# Patient Record
Sex: Female | Born: 1940 | Race: White | Hispanic: No | Marital: Married | Smoking: Never smoker
Health system: Southern US, Community
[De-identification: ages and names within clinical notes are randomized; demographics above are authoritative.]

## PROBLEM LIST (undated history)

## (undated) DIAGNOSIS — K219 Gastro-esophageal reflux disease without esophagitis: Secondary | ICD-10-CM

## (undated) DIAGNOSIS — K7460 Unspecified cirrhosis of liver: Secondary | ICD-10-CM

## (undated) DIAGNOSIS — F0390 Unspecified dementia without behavioral disturbance: Secondary | ICD-10-CM

## (undated) DIAGNOSIS — I4891 Unspecified atrial fibrillation: Secondary | ICD-10-CM

## (undated) DIAGNOSIS — E785 Hyperlipidemia, unspecified: Secondary | ICD-10-CM

## (undated) DIAGNOSIS — I10 Essential (primary) hypertension: Secondary | ICD-10-CM

## (undated) DIAGNOSIS — M1990 Unspecified osteoarthritis, unspecified site: Secondary | ICD-10-CM

## (undated) DIAGNOSIS — E119 Type 2 diabetes mellitus without complications: Secondary | ICD-10-CM

## (undated) DIAGNOSIS — E039 Hypothyroidism, unspecified: Secondary | ICD-10-CM

## (undated) DIAGNOSIS — C50919 Malignant neoplasm of unspecified site of unspecified female breast: Secondary | ICD-10-CM

## (undated) HISTORY — DX: Unspecified cirrhosis of liver: K74.60

## (undated) HISTORY — PX: BREAST SURGERY: SHX581

## (undated) HISTORY — DX: Malignant neoplasm of unspecified site of unspecified female breast: C50.919

## (undated) HISTORY — DX: Unspecified atrial fibrillation: I48.91

## (undated) HISTORY — DX: Hypothyroidism, unspecified: E03.9

## (undated) HISTORY — DX: Hyperlipidemia, unspecified: E78.5

---

## 1998-09-24 DIAGNOSIS — C50919 Malignant neoplasm of unspecified site of unspecified female breast: Secondary | ICD-10-CM

## 1998-09-24 HISTORY — DX: Malignant neoplasm of unspecified site of unspecified female breast: C50.919

## 2012-03-24 HISTORY — PX: COLONOSCOPY: SHX174

## 2012-12-23 ENCOUNTER — Encounter (INDEPENDENT_AMBULATORY_CARE_PROVIDER_SITE_OTHER): Payer: Medicare Other | Admitting: Internal Medicine

## 2012-12-23 DIAGNOSIS — Z170 Estrogen receptor positive status [ER+]: Secondary | ICD-10-CM

## 2012-12-23 DIAGNOSIS — C50919 Malignant neoplasm of unspecified site of unspecified female breast: Secondary | ICD-10-CM

## 2013-02-04 ENCOUNTER — Encounter (INDEPENDENT_AMBULATORY_CARE_PROVIDER_SITE_OTHER): Payer: Medicare Other | Admitting: Internal Medicine

## 2013-02-04 DIAGNOSIS — C50919 Malignant neoplasm of unspecified site of unspecified female breast: Secondary | ICD-10-CM

## 2013-08-25 ENCOUNTER — Encounter (INDEPENDENT_AMBULATORY_CARE_PROVIDER_SITE_OTHER): Payer: Medicare Other

## 2013-08-25 DIAGNOSIS — D61818 Other pancytopenia: Secondary | ICD-10-CM

## 2013-08-25 DIAGNOSIS — E039 Hypothyroidism, unspecified: Secondary | ICD-10-CM

## 2013-08-25 DIAGNOSIS — D509 Iron deficiency anemia, unspecified: Secondary | ICD-10-CM

## 2013-09-02 ENCOUNTER — Encounter (INDEPENDENT_AMBULATORY_CARE_PROVIDER_SITE_OTHER): Payer: Medicare Other

## 2013-09-02 DIAGNOSIS — R161 Splenomegaly, not elsewhere classified: Secondary | ICD-10-CM

## 2013-09-02 DIAGNOSIS — R748 Abnormal levels of other serum enzymes: Secondary | ICD-10-CM

## 2013-09-02 DIAGNOSIS — D61818 Other pancytopenia: Secondary | ICD-10-CM

## 2013-09-02 DIAGNOSIS — C50919 Malignant neoplasm of unspecified site of unspecified female breast: Secondary | ICD-10-CM

## 2013-10-01 DIAGNOSIS — D732 Chronic congestive splenomegaly: Secondary | ICD-10-CM | POA: Insufficient documentation

## 2013-10-01 DIAGNOSIS — D6959 Other secondary thrombocytopenia: Secondary | ICD-10-CM | POA: Insufficient documentation

## 2013-10-01 DIAGNOSIS — D731 Hypersplenism: Secondary | ICD-10-CM | POA: Insufficient documentation

## 2013-10-01 DIAGNOSIS — K7460 Unspecified cirrhosis of liver: Secondary | ICD-10-CM | POA: Insufficient documentation

## 2013-10-05 DIAGNOSIS — E611 Iron deficiency: Secondary | ICD-10-CM | POA: Insufficient documentation

## 2014-07-06 ENCOUNTER — Emergency Department (HOSPITAL_COMMUNITY): Payer: Medicare Other

## 2014-07-06 ENCOUNTER — Inpatient Hospital Stay (HOSPITAL_COMMUNITY)
Admission: EM | Admit: 2014-07-06 | Discharge: 2014-07-09 | DRG: 184 | Disposition: A | Discharge status: Skilled Nursing Facility | Payer: Medicare Other | Attending: General Surgery | Admitting: General Surgery

## 2014-07-06 ENCOUNTER — Encounter (HOSPITAL_COMMUNITY): Payer: Self-pay | Admitting: Emergency Medicine

## 2014-07-06 DIAGNOSIS — M1990 Unspecified osteoarthritis, unspecified site: Secondary | ICD-10-CM | POA: Diagnosis present

## 2014-07-06 DIAGNOSIS — Z794 Long term (current) use of insulin: Secondary | ICD-10-CM

## 2014-07-06 DIAGNOSIS — K7460 Unspecified cirrhosis of liver: Secondary | ICD-10-CM | POA: Diagnosis present

## 2014-07-06 DIAGNOSIS — D62 Acute posthemorrhagic anemia: Secondary | ICD-10-CM | POA: Diagnosis not present

## 2014-07-06 DIAGNOSIS — I10 Essential (primary) hypertension: Secondary | ICD-10-CM | POA: Diagnosis present

## 2014-07-06 DIAGNOSIS — S8002XA Contusion of left knee, initial encounter: Secondary | ICD-10-CM | POA: Diagnosis present

## 2014-07-06 DIAGNOSIS — S62101A Fracture of unspecified carpal bone, right wrist, initial encounter for closed fracture: Secondary | ICD-10-CM | POA: Diagnosis present

## 2014-07-06 DIAGNOSIS — S8001XA Contusion of right knee, initial encounter: Secondary | ICD-10-CM | POA: Diagnosis present

## 2014-07-06 DIAGNOSIS — R52 Pain, unspecified: Secondary | ICD-10-CM

## 2014-07-06 DIAGNOSIS — E039 Hypothyroidism, unspecified: Secondary | ICD-10-CM | POA: Diagnosis present

## 2014-07-06 DIAGNOSIS — Z23 Encounter for immunization: Secondary | ICD-10-CM

## 2014-07-06 DIAGNOSIS — R911 Solitary pulmonary nodule: Secondary | ICD-10-CM | POA: Diagnosis present

## 2014-07-06 DIAGNOSIS — Z79899 Other long term (current) drug therapy: Secondary | ICD-10-CM | POA: Diagnosis not present

## 2014-07-06 DIAGNOSIS — E119 Type 2 diabetes mellitus without complications: Secondary | ICD-10-CM | POA: Diagnosis present

## 2014-07-06 DIAGNOSIS — S2242XA Multiple fractures of ribs, left side, initial encounter for closed fracture: Principal | ICD-10-CM | POA: Diagnosis present

## 2014-07-06 DIAGNOSIS — R51 Headache: Secondary | ICD-10-CM | POA: Diagnosis present

## 2014-07-06 DIAGNOSIS — D696 Thrombocytopenia, unspecified: Secondary | ICD-10-CM | POA: Diagnosis present

## 2014-07-06 HISTORY — DX: Essential (primary) hypertension: I10

## 2014-07-06 HISTORY — DX: Type 2 diabetes mellitus without complications: E11.9

## 2014-07-06 LAB — SAMPLE TO BLOOD BANK

## 2014-07-06 LAB — COMPREHENSIVE METABOLIC PANEL
ALK PHOS: 162 U/L — AB (ref 39–117)
ALT: 26 U/L (ref 0–35)
ANION GAP: 13 (ref 5–15)
AST: 49 U/L — ABNORMAL HIGH (ref 0–37)
Albumin: 3 g/dL — ABNORMAL LOW (ref 3.5–5.2)
BILIRUBIN TOTAL: 0.8 mg/dL (ref 0.3–1.2)
BUN: 21 mg/dL (ref 6–23)
CHLORIDE: 103 meq/L (ref 96–112)
CO2: 22 meq/L (ref 19–32)
CREATININE: 1.11 mg/dL — AB (ref 0.50–1.10)
Calcium: 10.3 mg/dL (ref 8.4–10.5)
GFR, EST AFRICAN AMERICAN: 56 mL/min — AB (ref >90)
GFR, EST NON AFRICAN AMERICAN: 48 mL/min — AB (ref >90)
GLUCOSE: 219 mg/dL — AB (ref 70–99)
Potassium: 4.5 mEq/L (ref 3.7–5.3)
Sodium: 138 mEq/L (ref 137–147)
Total Protein: 7.2 g/dL (ref 6.0–8.3)

## 2014-07-06 LAB — ETHANOL: Alcohol, Ethyl (B): <11 mg/dL (ref 0–11)

## 2014-07-06 LAB — CBC
HCT: 37.2 % (ref 36.0–46.0)
Hemoglobin: 12.6 g/dL (ref 12.0–15.0)
MCH: 29.5 pg (ref 26.0–34.0)
MCHC: 33.9 g/dL (ref 30.0–36.0)
MCV: 87.1 fL (ref 78.0–100.0)
PLATELETS: 78 10*3/uL — AB (ref 150–400)
RBC: 4.27 MIL/uL (ref 3.87–5.11)
RDW: 14.1 % (ref 11.5–15.5)
WBC: 4.9 10*3/uL (ref 4.0–10.5)

## 2014-07-06 LAB — PROTIME-INR
INR: 1.33 (ref 0.00–1.49)
PROTHROMBIN TIME: 16.5 s — AB (ref 11.6–15.2)

## 2014-07-06 LAB — CDS SEROLOGY

## 2014-07-06 MED ORDER — NALOXONE HCL 0.4 MG/ML IJ SOLN: 0.4000 mg | INTRAMUSCULAR | Status: DC | PRN

## 2014-07-06 MED ORDER — KCL IN DEXTROSE-NACL 20-5-0.45 MEQ/L-%-% IV SOLN
INTRAVENOUS | Status: DC
  Administered 2014-07-07 (×3): via INTRAVENOUS
  Filled 2014-07-06 (×4): qty 1000

## 2014-07-06 MED ORDER — ONDANSETRON HCL 4 MG/2ML IJ SOLN: 4.0000 mg | Freq: Four times a day (QID) | INTRAMUSCULAR | Status: DC | PRN

## 2014-07-06 MED ORDER — INSULIN ASPART 100 UNIT/ML ~~LOC~~ SOLN
0.0000 [IU] | SUBCUTANEOUS | Status: DC
  Administered 2014-07-07: 11 [IU] via SUBCUTANEOUS
  Administered 2014-07-07: 7 [IU] via SUBCUTANEOUS
  Administered 2014-07-07: 11 [IU] via SUBCUTANEOUS

## 2014-07-06 MED ORDER — ONDANSETRON HCL 4 MG PO TABS: 4.0000 mg | ORAL_TABLET | Freq: Four times a day (QID) | ORAL | Status: DC | PRN

## 2014-07-06 MED ORDER — LISINOPRIL 10 MG PO TABS
10.0000 mg | ORAL_TABLET | Freq: Every day | ORAL | Status: DC
  Administered 2014-07-07 – 2014-07-09 (×2): 10 mg via ORAL
  Filled 2014-07-06 (×3): qty 1

## 2014-07-06 MED ORDER — IOHEXOL 300 MG/ML  SOLN
80.0000 mL | Freq: Once | INTRAMUSCULAR | Status: AC | PRN
  Administered 2014-07-06: 80 mL via INTRAVENOUS

## 2014-07-06 MED ORDER — FENTANYL CITRATE 0.05 MG/ML IJ SOLN
50.0000 ug | Freq: Once | INTRAMUSCULAR | Status: AC
  Administered 2014-07-06: 50 ug via INTRAVENOUS
  Filled 2014-07-06: qty 2

## 2014-07-06 MED ORDER — TETANUS-DIPHTH-ACELL PERTUSSIS 5-2.5-18.5 LF-MCG/0.5 IM SUSP
0.5000 mL | Freq: Once | INTRAMUSCULAR | Status: AC
  Administered 2014-07-06: 0.5 mL via INTRAMUSCULAR
  Filled 2014-07-06: qty 0.5

## 2014-07-06 MED ORDER — MORPHINE SULFATE (PF) 1 MG/ML IV SOLN
INTRAVENOUS | Status: DC
  Administered 2014-07-07: 1 mg via INTRAVENOUS
  Administered 2014-07-07 (×2): 3 mg via INTRAVENOUS
  Filled 2014-07-06: qty 25

## 2014-07-06 MED ORDER — SODIUM CHLORIDE 0.9 % IJ SOLN: 9.0000 mL | INTRAMUSCULAR | Status: DC | PRN

## 2014-07-06 MED ORDER — SODIUM CHLORIDE 0.9 % IV BOLUS (SEPSIS)
500.0000 mL | Freq: Once | INTRAVENOUS | Status: AC
  Administered 2014-07-06: 500 mL via INTRAVENOUS

## 2014-07-06 MED ORDER — OXYCODONE HCL 5 MG PO TABS
10.0000 mg | ORAL_TABLET | ORAL | Status: DC | PRN
  Administered 2014-07-06: 10 mg via ORAL
  Filled 2014-07-06: qty 2

## 2014-07-06 MED ORDER — ANASTROZOLE 1 MG PO TABS
1.0000 mg | ORAL_TABLET | Freq: Every day | ORAL | Status: DC
  Administered 2014-07-07 – 2014-07-08 (×2): 1 mg via ORAL
  Filled 2014-07-06 (×4): qty 1

## 2014-07-06 MED ORDER — OXYCODONE HCL 5 MG PO TABS: 2.5000 mg | ORAL_TABLET | ORAL | Status: DC | PRN

## 2014-07-06 MED ORDER — PANTOPRAZOLE SODIUM 40 MG IV SOLR
40.0000 mg | Freq: Every day | INTRAVENOUS | Status: DC
  Filled 2014-07-06: qty 40

## 2014-07-06 MED ORDER — DIPHENHYDRAMINE HCL 12.5 MG/5ML PO ELIX
12.5000 mg | ORAL_SOLUTION | Freq: Four times a day (QID) | ORAL | Status: DC | PRN
  Filled 2014-07-06: qty 5

## 2014-07-06 MED ORDER — DIPHENHYDRAMINE HCL 50 MG/ML IJ SOLN: 12.5000 mg | Freq: Four times a day (QID) | INTRAMUSCULAR | Status: DC | PRN

## 2014-07-06 MED ORDER — LEVOTHYROXINE SODIUM 75 MCG PO TABS
75.0000 ug | ORAL_TABLET | Freq: Every day | ORAL | Status: DC
  Administered 2014-07-07 – 2014-07-09 (×3): 75 ug via ORAL
  Filled 2014-07-06 (×5): qty 1

## 2014-07-06 MED ORDER — ENOXAPARIN SODIUM 40 MG/0.4ML ~~LOC~~ SOLN
40.0000 mg | SUBCUTANEOUS | Status: DC
  Filled 2014-07-06: qty 0.4

## 2014-07-06 MED ORDER — PANTOPRAZOLE SODIUM 40 MG PO TBEC: 40.0000 mg | DELAYED_RELEASE_TABLET | Freq: Every day | ORAL | Status: DC

## 2014-07-06 MED ORDER — OXYCODONE HCL 5 MG PO TABS: 5.0000 mg | ORAL_TABLET | ORAL | Status: DC | PRN

## 2014-07-06 NOTE — Progress Notes (Signed)
Orthopedic Tech Progress Note Patient Details:  Melissa Rose Feb 07, 1941 [id number]  Ortho Devices Type of Ortho Device: Ace wrap;Thumb spica splint Splint Material: Fiberglass Ortho Device/Splint Location: RUE Ortho Device/Splint Interventions: Ordered;Application   Braulio Bosch 07/06/2014, 7:29 PM
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
## 2014-07-07 DIAGNOSIS — S62101A Fracture of unspecified carpal bone, right wrist, initial encounter for closed fracture: Secondary | ICD-10-CM | POA: Diagnosis present

## 2014-07-07 DIAGNOSIS — C50919 Malignant neoplasm of unspecified site of unspecified female breast: Secondary | ICD-10-CM | POA: Insufficient documentation

## 2014-07-07 DIAGNOSIS — D62 Acute posthemorrhagic anemia: Secondary | ICD-10-CM | POA: Diagnosis not present

## 2014-07-07 DIAGNOSIS — E119 Type 2 diabetes mellitus without complications: Secondary | ICD-10-CM | POA: Insufficient documentation

## 2014-07-07 DIAGNOSIS — M1990 Unspecified osteoarthritis, unspecified site: Secondary | ICD-10-CM | POA: Insufficient documentation

## 2014-07-07 DIAGNOSIS — R911 Solitary pulmonary nodule: Secondary | ICD-10-CM | POA: Diagnosis present

## 2014-07-07 LAB — BASIC METABOLIC PANEL
Anion gap: 12 (ref 5–15)
BUN: 23 mg/dL (ref 6–23)
CHLORIDE: 105 meq/L (ref 96–112)
CO2: 20 mEq/L (ref 19–32)
Calcium: 9.8 mg/dL (ref 8.4–10.5)
Creatinine, Ser: 1.02 mg/dL (ref 0.50–1.10)
GFR calc Af Amer: 62 mL/min — ABNORMAL LOW (ref >90)
GFR, EST NON AFRICAN AMERICAN: 54 mL/min — AB (ref >90)
Glucose, Bld: 290 mg/dL — ABNORMAL HIGH (ref 70–99)
Potassium: 5.2 mEq/L (ref 3.7–5.3)
Sodium: 137 mEq/L (ref 137–147)

## 2014-07-07 LAB — CBC
HEMATOCRIT: 35.3 % — AB (ref 36.0–46.0)
HEMOGLOBIN: 11.8 g/dL — AB (ref 12.0–15.0)
MCH: 29.4 pg (ref 26.0–34.0)
MCHC: 33.4 g/dL (ref 30.0–36.0)
MCV: 87.8 fL (ref 78.0–100.0)
Platelets: 66 10*3/uL — ABNORMAL LOW (ref 150–400)
RBC: 4.02 MIL/uL (ref 3.87–5.11)
RDW: 14.2 % (ref 11.5–15.5)
WBC: 7.1 10*3/uL (ref 4.0–10.5)

## 2014-07-07 LAB — GLUCOSE, CAPILLARY
GLUCOSE-CAPILLARY: 279 mg/dL — AB (ref 70–99)
GLUCOSE-CAPILLARY: 294 mg/dL — AB (ref 70–99)
GLUCOSE-CAPILLARY: 238 mg/dL — AB (ref 70–99)
Glucose-Capillary: 286 mg/dL — ABNORMAL HIGH (ref 70–99)
Glucose-Capillary: 231 mg/dL — ABNORMAL HIGH (ref 70–99)
Glucose-Capillary: 86 mg/dL (ref 70–99)

## 2014-07-07 LAB — HEMOGLOBIN A1C
Hgb A1c MFr Bld: 9.3 % — ABNORMAL HIGH (ref <5.7)
MEAN PLASMA GLUCOSE: 220 mg/dL — AB (ref <117)

## 2014-07-07 LAB — MRSA PCR SCREENING: MRSA BY PCR: POSITIVE — AB

## 2014-07-07 MED ORDER — MUPIROCIN 2 % EX OINT
1.0000 "application " | TOPICAL_OINTMENT | Freq: Two times a day (BID) | CUTANEOUS | Status: DC
  Administered 2014-07-07 – 2014-07-09 (×4): 1 via NASAL
  Filled 2014-07-07 (×2): qty 22

## 2014-07-07 MED ORDER — GLIPIZIDE 5 MG PO TABS
5.0000 mg | ORAL_TABLET | Freq: Two times a day (BID) | ORAL | Status: DC
  Administered 2014-07-07 – 2014-07-09 (×5): 5 mg via ORAL
  Filled 2014-07-07 (×9): qty 1

## 2014-07-07 MED ORDER — NAPROXEN 500 MG PO TABS
500.0000 mg | ORAL_TABLET | Freq: Two times a day (BID) | ORAL | Status: DC
  Administered 2014-07-07 – 2014-07-09 (×5): 500 mg via ORAL
  Filled 2014-07-07 (×5): qty 1
  Filled 2014-07-07 (×2): qty 2
  Filled 2014-07-07 (×2): qty 1

## 2014-07-07 MED ORDER — ENOXAPARIN SODIUM 30 MG/0.3ML ~~LOC~~ SOLN
30.0000 mg | Freq: Two times a day (BID) | SUBCUTANEOUS | Status: DC
  Administered 2014-07-07 – 2014-07-09 (×5): 30 mg via SUBCUTANEOUS
  Filled 2014-07-07 (×7): qty 0.3

## 2014-07-07 MED ORDER — INSULIN ASPART 100 UNIT/ML ~~LOC~~ SOLN
10.0000 [IU] | Freq: Three times a day (TID) | SUBCUTANEOUS | Status: DC
  Administered 2014-07-07 – 2014-07-08 (×3): 10 [IU] via SUBCUTANEOUS

## 2014-07-07 MED ORDER — MORPHINE SULFATE 2 MG/ML IJ SOLN: 2.0000 mg | INTRAMUSCULAR | Status: DC | PRN

## 2014-07-07 MED ORDER — INSULIN ASPART 100 UNIT/ML ~~LOC~~ SOLN
0.0000 [IU] | Freq: Three times a day (TID) | SUBCUTANEOUS | Status: DC
  Administered 2014-07-07: 7 [IU] via SUBCUTANEOUS
  Administered 2014-07-07: 11 [IU] via SUBCUTANEOUS
  Administered 2014-07-08: 7 [IU] via SUBCUTANEOUS

## 2014-07-07 MED ORDER — TRAMADOL HCL 50 MG PO TABS
50.0000 mg | ORAL_TABLET | Freq: Four times a day (QID) | ORAL | Status: DC | PRN
  Administered 2014-07-08 (×2): 50 mg via ORAL
  Administered 2014-07-09: 100 mg via ORAL
  Filled 2014-07-07 (×2): qty 1
  Filled 2014-07-07: qty 2

## 2014-07-07 MED ORDER — INSULIN GLARGINE 100 UNIT/ML ~~LOC~~ SOLN
30.0000 [IU] | Freq: Two times a day (BID) | SUBCUTANEOUS | Status: DC
  Administered 2014-07-07 – 2014-07-09 (×4): 30 [IU] via SUBCUTANEOUS
  Filled 2014-07-07 (×6): qty 0.3

## 2014-07-07 MED ORDER — POLYETHYLENE GLYCOL 3350 17 G PO PACK
17.0000 g | PACK | Freq: Every day | ORAL | Status: DC
  Administered 2014-07-07 – 2014-07-08 (×2): 17 g via ORAL
  Filled 2014-07-07 (×3): qty 1

## 2014-07-07 MED ORDER — DOCUSATE SODIUM 100 MG PO CAPS
100.0000 mg | ORAL_CAPSULE | Freq: Two times a day (BID) | ORAL | Status: DC
  Administered 2014-07-07 – 2014-07-09 (×5): 100 mg via ORAL
  Filled 2014-07-07 (×5): qty 1

## 2014-07-07 MED ORDER — CHLORHEXIDINE GLUCONATE CLOTH 2 % EX PADS
6.0000 | MEDICATED_PAD | Freq: Every day | CUTANEOUS | Status: DC
  Administered 2014-07-08 – 2014-07-09 (×2): 6 via TOPICAL
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
## 2014-07-07 NOTE — Progress Notes (Signed)
UR completed.  Lizbet Cirrincione, RN BSN MHA CCM Trauma/Neuro ICU Case Manager [phone]  

## 2014-07-07 NOTE — Progress Notes (Signed)
Manage pain and pulmonary function should improve.  This patient has been seen and I agree with the findings and treatment plan.  Kathryne Eriksson. Dahlia Bailiff, MD, [place] [phone] (pager) [phone] (direct pager) Trauma Surgeon
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
## 2014-07-08 LAB — GLUCOSE, CAPILLARY
GLUCOSE-CAPILLARY: 199 mg/dL — AB (ref 70–99)
GLUCOSE-CAPILLARY: 111 mg/dL — AB (ref 70–99)
GLUCOSE-CAPILLARY: 134 mg/dL — AB (ref 70–99)
Glucose-Capillary: 223 mg/dL — ABNORMAL HIGH (ref 70–99)

## 2014-07-08 LAB — CBC
HEMATOCRIT: 29.6 % — AB (ref 36.0–46.0)
HEMOGLOBIN: 9.9 g/dL — AB (ref 12.0–15.0)
MCH: 30.1 pg (ref 26.0–34.0)
MCHC: 33.4 g/dL (ref 30.0–36.0)
MCV: 90 fL (ref 78.0–100.0)
Platelets: 65 10*3/uL — ABNORMAL LOW (ref 150–400)
RBC: 3.29 MIL/uL — ABNORMAL LOW (ref 3.87–5.11)
RDW: 14.6 % (ref 11.5–15.5)
WBC: 6.2 10*3/uL (ref 4.0–10.5)

## 2014-07-08 MED ORDER — INSULIN ASPART 100 UNIT/ML ~~LOC~~ SOLN
10.0000 [IU] | Freq: Three times a day (TID) | SUBCUTANEOUS | Status: DC
  Administered 2014-07-08 – 2014-07-09 (×3): 10 [IU] via SUBCUTANEOUS

## 2014-07-08 MED ORDER — INSULIN ASPART 100 UNIT/ML ~~LOC~~ SOLN
0.0000 [IU] | Freq: Three times a day (TID) | SUBCUTANEOUS | Status: DC
  Administered 2014-07-08: 4 [IU] via SUBCUTANEOUS
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
## 2014-07-08 NOTE — Progress Notes (Signed)
PCA order has been d/c'd today. 3 mg of morphine was self administered to patient prior to night shift. 10 mg of morphine wasted in sink with Trilby Drummer, Therapist, sports. Waste documented in pyxis.

## 2014-07-08 NOTE — Progress Notes (Signed)
Pt being transferred to 6N08. Report given and all questions answered. All belongings and medications sent with patient. VSS. Transfer off telemetry.

## 2014-07-08 NOTE — Clinical Social Work Placement (Signed)
Clinical Social Work Department CLINICAL SOCIAL WORK PLACEMENT NOTE 07/08/2014  Patient:  Melissa Rose, Melissa Rose  Account Number:  [id number] Admit date:  07/06/2014  Clinical Social Worker:  Paulette Blanch Espn Zeman, LCSWA  Date/time:  07/08/2014 12:06 PM  Clinical Social Work is seeking post-discharge placement for this patient at the following level of care:   SKILLED NURSING   (*CSW will update this form in Epic as items are completed)   07/08/2014  Patient/family provided with [facility] Department of Clinical Social Work's list of facilities offering this level of care within the geographic area requested by the patient (or if unable, by the patient's family).  07/08/2014  Patient/family informed of their freedom to choose among providers that offer the needed level of care, that participate in Medicare, Medicaid or managed care program needed by the patient, have an available bed and are willing to accept the patient.  07/08/2014  Patient/family informed of [facility]' ownership interest in [facility], as well as of the fact that they are under no obligation to receive care at this facility.  PASARR submitted to EDS on 07/08/2014 PASARR number received on 07/08/2014  FL2 transmitted to all facilities in geographic area requested by pt/family on  07/08/2014 FL2 transmitted to all facilities within larger geographic area on 07/08/2014  Patient informed that his/her managed care company has contracts with or will negotiate with  certain facilities, including the following:     Patient/family informed of bed offers received:   Patient chooses bed at  Physician recommends and patient chooses bed at    Patient to be transferred to  on   Patient to be transferred to facility by  Patient and family notified of transfer on  Name of family member notified:    The following physician request were entered in Epic:   Additional Comments:   [place], MSW, [place]
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
## 2014-07-08 NOTE — Progress Notes (Signed)
OT Cancellation Note  Patient Details Name: Melissa Rose MRN: [id number] DOB: Jan 25, 1941   Cancelled Treatment:    Reason Eval/Treat Not Completed: Other (comment) Attempted to see pt - in process of being transferred to floor. Will see in am. [place], OTR/L  [phone] 07/08/2014 07/08/2014, 5:36 PM

## 2014-07-08 NOTE — Progress Notes (Signed)
Doing better on IS. Transfer to floor. I spoke with her husband. Patient examined and I agree with the assessment and plan  Georganna Skeans, MD, MPH, FACS Trauma: [phone] General Surgery: [phone]  07/08/2014 10:17 AM

## 2014-07-08 NOTE — Clinical Social Work Note (Signed)
CSW received report from previous Education officer, museum.  CSW faxed physical therapy notes to [facility] facilities, still awaiting bed offers.  CSW will continue to follow for possible DC tomorrow.  Domenica Reamer, [place] Social Worker [phone]

## 2014-07-08 NOTE — Progress Notes (Signed)
Orthopedic Tech Progress Note Patient Details:  Melissa Rose [birth year] [id number]  Ortho Devices Type of Ortho Device: Thumb velcro splint Splint Material: Fiberglass Ortho Device/Splint Location: RUE Ortho Device/Splint Interventions: Application   [name] 07/08/2014, 12:46 PM

## 2014-07-09 LAB — CBC
HCT: 30.5 % — ABNORMAL LOW (ref 36.0–46.0)
Hemoglobin: 10.3 g/dL — ABNORMAL LOW (ref 12.0–15.0)
MCH: 29.4 pg (ref 26.0–34.0)
MCHC: 33.8 g/dL (ref 30.0–36.0)
MCV: 87.1 fL (ref 78.0–100.0)
Platelets: 67 10*3/uL — ABNORMAL LOW (ref 150–400)
RBC: 3.5 MIL/uL — AB (ref 3.87–5.11)
RDW: 14.3 % (ref 11.5–15.5)
WBC: 5.1 10*3/uL (ref 4.0–10.5)

## 2014-07-09 LAB — GLUCOSE, CAPILLARY
Glucose-Capillary: 88 mg/dL (ref 70–99)
Glucose-Capillary: 89 mg/dL (ref 70–99)

## 2014-07-09 MED ORDER — TRAMADOL HCL 50 MG PO TABS: 50.0000 mg | ORAL_TABLET | Freq: Four times a day (QID) | ORAL | Status: DC | PRN

## 2014-07-09 MED ORDER — PNEUMOCOCCAL VAC POLYVALENT 25 MCG/0.5ML IJ INJ: 0.5000 mL | INJECTION | INTRAMUSCULAR | Status: DC

## 2014-07-09 MED ORDER — NAPROXEN 500 MG PO TABS: 500.0000 mg | ORAL_TABLET | Freq: Two times a day (BID) | ORAL | Status: DC

## 2014-07-09 MED ORDER — METFORMIN HCL 500 MG PO TABS
500.0000 mg | ORAL_TABLET | Freq: Two times a day (BID) | ORAL | Status: DC
  Administered 2014-07-09: 500 mg via ORAL
  Filled 2014-07-09: qty 1

## 2014-07-09 MED ORDER — INFLUENZA VAC SPLIT QUAD 0.5 ML IM SUSY: 0.5000 mL | PREFILLED_SYRINGE | INTRAMUSCULAR | Status: DC

## 2014-07-09 NOTE — Discharge Summary (Signed)
Eleana Tocco, MD, MPH, FACS Trauma: [phone] General Surgery: [phone]  

## 2014-07-09 NOTE — Progress Notes (Signed)
IV removed per order. Husband and daughter here to transport patient to [facility] in [place]. Paperwork given to family to take there. Report called to nurse at [facility]. Melford Aase, RN

## 2014-07-09 NOTE — Progress Notes (Signed)
D/W CSW - plan SNF placement. Medically stable. Patient examined and I agree with the assessment and plan  Georganna Skeans, MD, MPH, FACS Trauma: [phone] General Surgery: [phone]  07/09/2014 9:49 AM
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
## 2014-07-09 NOTE — Progress Notes (Signed)
Discharged to husband's care via wheelchair with NT present. Melford Aase, RN
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
## 2014-07-09 NOTE — Clinical Social Work Note (Signed)
CSW spoke with patient at bedside- patient is agreeable to SNF at [facility] in [place], [place] also spoke with patient daughter, Ms. Reynolds, who is agreeable to this plan.  CSW confirmed DC with [facility] is able to admit patient at 3:30.  Patients family is able to provide transportation to the facility.  CSW will continue to follow.  Domenica Reamer, [place] Social Worker [phone]
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
## 2015-10-28 DIAGNOSIS — L2089 Other atopic dermatitis: Secondary | ICD-10-CM | POA: Diagnosis not present

## 2015-10-28 DIAGNOSIS — Z1389 Encounter for screening for other disorder: Secondary | ICD-10-CM | POA: Diagnosis not present

## 2015-10-28 DIAGNOSIS — I10 Essential (primary) hypertension: Secondary | ICD-10-CM | POA: Diagnosis not present

## 2015-10-28 DIAGNOSIS — Z6829 Body mass index (BMI) 29.0-29.9, adult: Secondary | ICD-10-CM | POA: Diagnosis not present

## 2015-10-28 DIAGNOSIS — Z0000 Encounter for general adult medical examination without abnormal findings: Secondary | ICD-10-CM | POA: Diagnosis not present

## 2015-10-28 DIAGNOSIS — E1129 Type 2 diabetes mellitus with other diabetic kidney complication: Secondary | ICD-10-CM | POA: Diagnosis not present

## 2016-01-26 DIAGNOSIS — Z0000 Encounter for general adult medical examination without abnormal findings: Secondary | ICD-10-CM | POA: Diagnosis not present

## 2016-01-26 DIAGNOSIS — E1129 Type 2 diabetes mellitus with other diabetic kidney complication: Secondary | ICD-10-CM | POA: Diagnosis not present

## 2016-01-26 DIAGNOSIS — I10 Essential (primary) hypertension: Secondary | ICD-10-CM | POA: Diagnosis not present

## 2016-03-12 DIAGNOSIS — Z13820 Encounter for screening for osteoporosis: Secondary | ICD-10-CM | POA: Diagnosis not present

## 2016-03-12 DIAGNOSIS — M810 Age-related osteoporosis without current pathological fracture: Secondary | ICD-10-CM | POA: Diagnosis not present

## 2016-04-27 DIAGNOSIS — E1129 Type 2 diabetes mellitus with other diabetic kidney complication: Secondary | ICD-10-CM | POA: Diagnosis not present

## 2016-04-27 DIAGNOSIS — I10 Essential (primary) hypertension: Secondary | ICD-10-CM | POA: Diagnosis not present

## 2016-05-25 DIAGNOSIS — I10 Essential (primary) hypertension: Secondary | ICD-10-CM | POA: Diagnosis not present

## 2016-05-25 DIAGNOSIS — E1129 Type 2 diabetes mellitus with other diabetic kidney complication: Secondary | ICD-10-CM | POA: Diagnosis not present

## 2016-07-04 DIAGNOSIS — E1129 Type 2 diabetes mellitus with other diabetic kidney complication: Secondary | ICD-10-CM | POA: Diagnosis not present

## 2016-07-04 DIAGNOSIS — I10 Essential (primary) hypertension: Secondary | ICD-10-CM | POA: Diagnosis not present

## 2016-07-31 DIAGNOSIS — I872 Venous insufficiency (chronic) (peripheral): Secondary | ICD-10-CM | POA: Diagnosis not present

## 2016-07-31 DIAGNOSIS — I10 Essential (primary) hypertension: Secondary | ICD-10-CM | POA: Diagnosis not present

## 2016-07-31 DIAGNOSIS — E1129 Type 2 diabetes mellitus with other diabetic kidney complication: Secondary | ICD-10-CM | POA: Diagnosis not present

## 2016-08-13 DIAGNOSIS — E1129 Type 2 diabetes mellitus with other diabetic kidney complication: Secondary | ICD-10-CM | POA: Diagnosis not present

## 2016-08-13 DIAGNOSIS — I10 Essential (primary) hypertension: Secondary | ICD-10-CM | POA: Diagnosis not present

## 2016-09-06 DIAGNOSIS — I10 Essential (primary) hypertension: Secondary | ICD-10-CM | POA: Diagnosis not present

## 2016-09-06 DIAGNOSIS — E1129 Type 2 diabetes mellitus with other diabetic kidney complication: Secondary | ICD-10-CM | POA: Diagnosis not present

## 2016-10-03 DIAGNOSIS — I10 Essential (primary) hypertension: Secondary | ICD-10-CM | POA: Diagnosis not present

## 2016-10-03 DIAGNOSIS — E1129 Type 2 diabetes mellitus with other diabetic kidney complication: Secondary | ICD-10-CM | POA: Diagnosis not present

## 2016-11-02 DIAGNOSIS — Z0000 Encounter for general adult medical examination without abnormal findings: Secondary | ICD-10-CM | POA: Diagnosis not present

## 2016-11-02 DIAGNOSIS — I10 Essential (primary) hypertension: Secondary | ICD-10-CM | POA: Diagnosis not present

## 2016-11-02 DIAGNOSIS — I872 Venous insufficiency (chronic) (peripheral): Secondary | ICD-10-CM | POA: Diagnosis not present

## 2016-11-02 DIAGNOSIS — Z1389 Encounter for screening for other disorder: Secondary | ICD-10-CM | POA: Diagnosis not present

## 2016-11-02 DIAGNOSIS — E1129 Type 2 diabetes mellitus with other diabetic kidney complication: Secondary | ICD-10-CM | POA: Diagnosis not present

## 2016-11-15 DIAGNOSIS — Z823 Family history of stroke: Secondary | ICD-10-CM | POA: Diagnosis not present

## 2016-11-15 DIAGNOSIS — L03116 Cellulitis of left lower limb: Secondary | ICD-10-CM | POA: Diagnosis not present

## 2016-11-15 DIAGNOSIS — R9431 Abnormal electrocardiogram [ECG] [EKG]: Secondary | ICD-10-CM | POA: Diagnosis not present

## 2016-11-15 DIAGNOSIS — C50811 Malignant neoplasm of overlapping sites of right female breast: Secondary | ICD-10-CM | POA: Diagnosis not present

## 2016-11-15 DIAGNOSIS — D6489 Other specified anemias: Secondary | ICD-10-CM | POA: Diagnosis not present

## 2016-11-15 DIAGNOSIS — C50911 Malignant neoplasm of unspecified site of right female breast: Secondary | ICD-10-CM | POA: Diagnosis not present

## 2016-11-15 DIAGNOSIS — D649 Anemia, unspecified: Secondary | ICD-10-CM | POA: Diagnosis not present

## 2016-11-15 DIAGNOSIS — E1129 Type 2 diabetes mellitus with other diabetic kidney complication: Secondary | ICD-10-CM | POA: Diagnosis not present

## 2016-11-15 DIAGNOSIS — Z780 Asymptomatic menopausal state: Secondary | ICD-10-CM | POA: Diagnosis not present

## 2016-11-15 DIAGNOSIS — E871 Hypo-osmolality and hyponatremia: Secondary | ICD-10-CM | POA: Diagnosis not present

## 2016-11-15 DIAGNOSIS — Z8249 Family history of ischemic heart disease and other diseases of the circulatory system: Secondary | ICD-10-CM | POA: Diagnosis not present

## 2016-11-15 DIAGNOSIS — E039 Hypothyroidism, unspecified: Secondary | ICD-10-CM | POA: Diagnosis not present

## 2016-11-15 DIAGNOSIS — I10 Essential (primary) hypertension: Secondary | ICD-10-CM | POA: Diagnosis not present

## 2016-11-15 DIAGNOSIS — I872 Venous insufficiency (chronic) (peripheral): Secondary | ICD-10-CM | POA: Diagnosis not present

## 2016-11-15 DIAGNOSIS — R918 Other nonspecific abnormal finding of lung field: Secondary | ICD-10-CM | POA: Diagnosis not present

## 2016-11-15 DIAGNOSIS — L03115 Cellulitis of right lower limb: Secondary | ICD-10-CM | POA: Diagnosis not present

## 2016-11-15 DIAGNOSIS — E119 Type 2 diabetes mellitus without complications: Secondary | ICD-10-CM | POA: Diagnosis not present

## 2016-11-15 DIAGNOSIS — Z79899 Other long term (current) drug therapy: Secondary | ICD-10-CM | POA: Diagnosis not present

## 2016-11-15 DIAGNOSIS — Z794 Long term (current) use of insulin: Secondary | ICD-10-CM | POA: Diagnosis not present

## 2016-11-15 DIAGNOSIS — D696 Thrombocytopenia, unspecified: Secondary | ICD-10-CM | POA: Diagnosis not present

## 2016-11-15 DIAGNOSIS — Z833 Family history of diabetes mellitus: Secondary | ICD-10-CM | POA: Diagnosis not present

## 2016-11-15 DIAGNOSIS — E785 Hyperlipidemia, unspecified: Secondary | ICD-10-CM | POA: Diagnosis not present

## 2016-11-16 DIAGNOSIS — Z833 Family history of diabetes mellitus: Secondary | ICD-10-CM | POA: Diagnosis not present

## 2016-11-16 DIAGNOSIS — I10 Essential (primary) hypertension: Secondary | ICD-10-CM | POA: Diagnosis not present

## 2016-11-16 DIAGNOSIS — E871 Hypo-osmolality and hyponatremia: Secondary | ICD-10-CM | POA: Diagnosis not present

## 2016-11-16 DIAGNOSIS — Z79899 Other long term (current) drug therapy: Secondary | ICD-10-CM | POA: Diagnosis not present

## 2016-11-16 DIAGNOSIS — E1129 Type 2 diabetes mellitus with other diabetic kidney complication: Secondary | ICD-10-CM | POA: Diagnosis not present

## 2016-11-16 DIAGNOSIS — Z780 Asymptomatic menopausal state: Secondary | ICD-10-CM | POA: Diagnosis not present

## 2016-11-16 DIAGNOSIS — C50911 Malignant neoplasm of unspecified site of right female breast: Secondary | ICD-10-CM | POA: Diagnosis not present

## 2016-11-16 DIAGNOSIS — E039 Hypothyroidism, unspecified: Secondary | ICD-10-CM | POA: Diagnosis not present

## 2016-11-16 DIAGNOSIS — E785 Hyperlipidemia, unspecified: Secondary | ICD-10-CM | POA: Diagnosis not present

## 2016-11-16 DIAGNOSIS — D696 Thrombocytopenia, unspecified: Secondary | ICD-10-CM | POA: Diagnosis not present

## 2016-11-16 DIAGNOSIS — I872 Venous insufficiency (chronic) (peripheral): Secondary | ICD-10-CM | POA: Diagnosis not present

## 2016-11-16 DIAGNOSIS — D649 Anemia, unspecified: Secondary | ICD-10-CM | POA: Diagnosis not present

## 2016-11-16 DIAGNOSIS — E119 Type 2 diabetes mellitus without complications: Secondary | ICD-10-CM | POA: Diagnosis not present

## 2016-11-16 DIAGNOSIS — L03116 Cellulitis of left lower limb: Secondary | ICD-10-CM | POA: Diagnosis not present

## 2016-11-16 DIAGNOSIS — Z823 Family history of stroke: Secondary | ICD-10-CM | POA: Diagnosis not present

## 2016-11-16 DIAGNOSIS — Z8249 Family history of ischemic heart disease and other diseases of the circulatory system: Secondary | ICD-10-CM | POA: Diagnosis not present

## 2016-11-16 DIAGNOSIS — L03115 Cellulitis of right lower limb: Secondary | ICD-10-CM | POA: Diagnosis not present

## 2016-11-16 DIAGNOSIS — Z794 Long term (current) use of insulin: Secondary | ICD-10-CM | POA: Diagnosis not present

## 2016-11-17 DIAGNOSIS — Z833 Family history of diabetes mellitus: Secondary | ICD-10-CM | POA: Diagnosis not present

## 2016-11-17 DIAGNOSIS — E785 Hyperlipidemia, unspecified: Secondary | ICD-10-CM | POA: Diagnosis not present

## 2016-11-17 DIAGNOSIS — E871 Hypo-osmolality and hyponatremia: Secondary | ICD-10-CM | POA: Diagnosis not present

## 2016-11-17 DIAGNOSIS — D649 Anemia, unspecified: Secondary | ICD-10-CM | POA: Diagnosis not present

## 2016-11-17 DIAGNOSIS — Z79899 Other long term (current) drug therapy: Secondary | ICD-10-CM | POA: Diagnosis not present

## 2016-11-17 DIAGNOSIS — Z794 Long term (current) use of insulin: Secondary | ICD-10-CM | POA: Diagnosis not present

## 2016-11-17 DIAGNOSIS — L03115 Cellulitis of right lower limb: Secondary | ICD-10-CM | POA: Diagnosis not present

## 2016-11-17 DIAGNOSIS — E039 Hypothyroidism, unspecified: Secondary | ICD-10-CM | POA: Diagnosis not present

## 2016-11-17 DIAGNOSIS — C50911 Malignant neoplasm of unspecified site of right female breast: Secondary | ICD-10-CM | POA: Diagnosis not present

## 2016-11-17 DIAGNOSIS — Z823 Family history of stroke: Secondary | ICD-10-CM | POA: Diagnosis not present

## 2016-11-17 DIAGNOSIS — I10 Essential (primary) hypertension: Secondary | ICD-10-CM | POA: Diagnosis not present

## 2016-11-17 DIAGNOSIS — Z8249 Family history of ischemic heart disease and other diseases of the circulatory system: Secondary | ICD-10-CM | POA: Diagnosis not present

## 2016-11-17 DIAGNOSIS — Z780 Asymptomatic menopausal state: Secondary | ICD-10-CM | POA: Diagnosis not present

## 2016-11-17 DIAGNOSIS — L03116 Cellulitis of left lower limb: Secondary | ICD-10-CM | POA: Diagnosis not present

## 2016-11-17 DIAGNOSIS — D696 Thrombocytopenia, unspecified: Secondary | ICD-10-CM | POA: Diagnosis not present

## 2016-11-17 DIAGNOSIS — E119 Type 2 diabetes mellitus without complications: Secondary | ICD-10-CM | POA: Diagnosis not present

## 2016-11-18 DIAGNOSIS — L03115 Cellulitis of right lower limb: Secondary | ICD-10-CM | POA: Diagnosis not present

## 2016-11-18 DIAGNOSIS — Z79899 Other long term (current) drug therapy: Secondary | ICD-10-CM | POA: Diagnosis not present

## 2016-11-18 DIAGNOSIS — L03116 Cellulitis of left lower limb: Secondary | ICD-10-CM | POA: Diagnosis not present

## 2016-11-18 DIAGNOSIS — Z8249 Family history of ischemic heart disease and other diseases of the circulatory system: Secondary | ICD-10-CM | POA: Diagnosis not present

## 2016-11-18 DIAGNOSIS — D696 Thrombocytopenia, unspecified: Secondary | ICD-10-CM | POA: Diagnosis not present

## 2016-11-18 DIAGNOSIS — Z823 Family history of stroke: Secondary | ICD-10-CM | POA: Diagnosis not present

## 2016-11-18 DIAGNOSIS — E119 Type 2 diabetes mellitus without complications: Secondary | ICD-10-CM | POA: Diagnosis not present

## 2016-11-18 DIAGNOSIS — E785 Hyperlipidemia, unspecified: Secondary | ICD-10-CM | POA: Diagnosis not present

## 2016-11-18 DIAGNOSIS — C50911 Malignant neoplasm of unspecified site of right female breast: Secondary | ICD-10-CM | POA: Diagnosis not present

## 2016-11-18 DIAGNOSIS — E871 Hypo-osmolality and hyponatremia: Secondary | ICD-10-CM | POA: Diagnosis not present

## 2016-11-18 DIAGNOSIS — D649 Anemia, unspecified: Secondary | ICD-10-CM | POA: Diagnosis not present

## 2016-11-18 DIAGNOSIS — Z794 Long term (current) use of insulin: Secondary | ICD-10-CM | POA: Diagnosis not present

## 2016-11-18 DIAGNOSIS — Z833 Family history of diabetes mellitus: Secondary | ICD-10-CM | POA: Diagnosis not present

## 2016-11-18 DIAGNOSIS — E039 Hypothyroidism, unspecified: Secondary | ICD-10-CM | POA: Diagnosis not present

## 2016-11-18 DIAGNOSIS — I10 Essential (primary) hypertension: Secondary | ICD-10-CM | POA: Diagnosis not present

## 2016-11-18 DIAGNOSIS — Z780 Asymptomatic menopausal state: Secondary | ICD-10-CM | POA: Diagnosis not present

## 2016-11-19 DIAGNOSIS — Z780 Asymptomatic menopausal state: Secondary | ICD-10-CM | POA: Diagnosis not present

## 2016-11-19 DIAGNOSIS — Z833 Family history of diabetes mellitus: Secondary | ICD-10-CM | POA: Diagnosis not present

## 2016-11-19 DIAGNOSIS — E785 Hyperlipidemia, unspecified: Secondary | ICD-10-CM | POA: Diagnosis not present

## 2016-11-19 DIAGNOSIS — Z8249 Family history of ischemic heart disease and other diseases of the circulatory system: Secondary | ICD-10-CM | POA: Diagnosis not present

## 2016-11-19 DIAGNOSIS — Z823 Family history of stroke: Secondary | ICD-10-CM | POA: Diagnosis not present

## 2016-11-19 DIAGNOSIS — E039 Hypothyroidism, unspecified: Secondary | ICD-10-CM | POA: Diagnosis not present

## 2016-11-19 DIAGNOSIS — Z794 Long term (current) use of insulin: Secondary | ICD-10-CM | POA: Diagnosis not present

## 2016-11-19 DIAGNOSIS — Z79899 Other long term (current) drug therapy: Secondary | ICD-10-CM | POA: Diagnosis not present

## 2016-11-19 DIAGNOSIS — E119 Type 2 diabetes mellitus without complications: Secondary | ICD-10-CM | POA: Diagnosis not present

## 2016-11-19 DIAGNOSIS — I10 Essential (primary) hypertension: Secondary | ICD-10-CM | POA: Diagnosis not present

## 2016-11-19 DIAGNOSIS — L03115 Cellulitis of right lower limb: Secondary | ICD-10-CM | POA: Diagnosis not present

## 2016-11-19 DIAGNOSIS — D649 Anemia, unspecified: Secondary | ICD-10-CM | POA: Diagnosis not present

## 2016-11-19 DIAGNOSIS — E871 Hypo-osmolality and hyponatremia: Secondary | ICD-10-CM | POA: Diagnosis not present

## 2016-11-19 DIAGNOSIS — D696 Thrombocytopenia, unspecified: Secondary | ICD-10-CM | POA: Diagnosis not present

## 2016-11-19 DIAGNOSIS — C50911 Malignant neoplasm of unspecified site of right female breast: Secondary | ICD-10-CM | POA: Diagnosis not present

## 2016-11-19 DIAGNOSIS — L03116 Cellulitis of left lower limb: Secondary | ICD-10-CM | POA: Diagnosis not present

## 2016-11-21 DIAGNOSIS — E119 Type 2 diabetes mellitus without complications: Secondary | ICD-10-CM | POA: Diagnosis not present

## 2016-11-21 DIAGNOSIS — Z7984 Long term (current) use of oral hypoglycemic drugs: Secondary | ICD-10-CM | POA: Diagnosis not present

## 2016-11-21 DIAGNOSIS — C50911 Malignant neoplasm of unspecified site of right female breast: Secondary | ICD-10-CM | POA: Diagnosis not present

## 2016-11-21 DIAGNOSIS — L03115 Cellulitis of right lower limb: Secondary | ICD-10-CM | POA: Diagnosis not present

## 2016-11-21 DIAGNOSIS — E785 Hyperlipidemia, unspecified: Secondary | ICD-10-CM | POA: Diagnosis not present

## 2016-11-21 DIAGNOSIS — L03116 Cellulitis of left lower limb: Secondary | ICD-10-CM | POA: Diagnosis not present

## 2016-11-21 DIAGNOSIS — C50811 Malignant neoplasm of overlapping sites of right female breast: Secondary | ICD-10-CM | POA: Diagnosis not present

## 2016-11-21 DIAGNOSIS — E039 Hypothyroidism, unspecified: Secondary | ICD-10-CM | POA: Diagnosis not present

## 2016-11-22 HISTORY — PX: COLONOSCOPY: SHX174

## 2016-11-22 HISTORY — PX: ESOPHAGOGASTRODUODENOSCOPY: SHX1529

## 2016-11-23 DIAGNOSIS — E785 Hyperlipidemia, unspecified: Secondary | ICD-10-CM | POA: Diagnosis not present

## 2016-11-23 DIAGNOSIS — E039 Hypothyroidism, unspecified: Secondary | ICD-10-CM | POA: Diagnosis not present

## 2016-11-23 DIAGNOSIS — Z7984 Long term (current) use of oral hypoglycemic drugs: Secondary | ICD-10-CM | POA: Diagnosis not present

## 2016-11-23 DIAGNOSIS — L03116 Cellulitis of left lower limb: Secondary | ICD-10-CM | POA: Diagnosis not present

## 2016-11-23 DIAGNOSIS — E119 Type 2 diabetes mellitus without complications: Secondary | ICD-10-CM | POA: Diagnosis not present

## 2016-11-23 DIAGNOSIS — C50911 Malignant neoplasm of unspecified site of right female breast: Secondary | ICD-10-CM | POA: Diagnosis not present

## 2016-11-23 DIAGNOSIS — L03115 Cellulitis of right lower limb: Secondary | ICD-10-CM | POA: Diagnosis not present

## 2016-11-26 DIAGNOSIS — Z7984 Long term (current) use of oral hypoglycemic drugs: Secondary | ICD-10-CM | POA: Diagnosis not present

## 2016-11-26 DIAGNOSIS — E039 Hypothyroidism, unspecified: Secondary | ICD-10-CM | POA: Diagnosis not present

## 2016-11-26 DIAGNOSIS — E785 Hyperlipidemia, unspecified: Secondary | ICD-10-CM | POA: Diagnosis not present

## 2016-11-26 DIAGNOSIS — E119 Type 2 diabetes mellitus without complications: Secondary | ICD-10-CM | POA: Diagnosis not present

## 2016-11-26 DIAGNOSIS — L03818 Cellulitis of other sites: Secondary | ICD-10-CM | POA: Diagnosis not present

## 2016-11-26 DIAGNOSIS — L03116 Cellulitis of left lower limb: Secondary | ICD-10-CM | POA: Diagnosis not present

## 2016-11-26 DIAGNOSIS — C50911 Malignant neoplasm of unspecified site of right female breast: Secondary | ICD-10-CM | POA: Diagnosis not present

## 2016-11-26 DIAGNOSIS — D500 Iron deficiency anemia secondary to blood loss (chronic): Secondary | ICD-10-CM | POA: Diagnosis not present

## 2016-11-26 DIAGNOSIS — L03115 Cellulitis of right lower limb: Secondary | ICD-10-CM | POA: Diagnosis not present

## 2016-11-27 DIAGNOSIS — E119 Type 2 diabetes mellitus without complications: Secondary | ICD-10-CM | POA: Diagnosis not present

## 2016-11-27 DIAGNOSIS — C50911 Malignant neoplasm of unspecified site of right female breast: Secondary | ICD-10-CM | POA: Diagnosis not present

## 2016-11-27 DIAGNOSIS — Z7984 Long term (current) use of oral hypoglycemic drugs: Secondary | ICD-10-CM | POA: Diagnosis not present

## 2016-11-27 DIAGNOSIS — E039 Hypothyroidism, unspecified: Secondary | ICD-10-CM | POA: Diagnosis not present

## 2016-11-27 DIAGNOSIS — L03116 Cellulitis of left lower limb: Secondary | ICD-10-CM | POA: Diagnosis not present

## 2016-11-27 DIAGNOSIS — L03115 Cellulitis of right lower limb: Secondary | ICD-10-CM | POA: Diagnosis not present

## 2016-11-27 DIAGNOSIS — E785 Hyperlipidemia, unspecified: Secondary | ICD-10-CM | POA: Diagnosis not present

## 2016-11-28 DIAGNOSIS — R922 Inconclusive mammogram: Secondary | ICD-10-CM | POA: Diagnosis not present

## 2016-11-28 DIAGNOSIS — Z9889 Other specified postprocedural states: Secondary | ICD-10-CM | POA: Diagnosis not present

## 2016-11-28 DIAGNOSIS — Z853 Personal history of malignant neoplasm of breast: Secondary | ICD-10-CM | POA: Diagnosis not present

## 2016-11-29 DIAGNOSIS — C50911 Malignant neoplasm of unspecified site of right female breast: Secondary | ICD-10-CM | POA: Diagnosis not present

## 2016-11-29 DIAGNOSIS — L03116 Cellulitis of left lower limb: Secondary | ICD-10-CM | POA: Diagnosis not present

## 2016-11-29 DIAGNOSIS — E785 Hyperlipidemia, unspecified: Secondary | ICD-10-CM | POA: Diagnosis not present

## 2016-11-29 DIAGNOSIS — Z7984 Long term (current) use of oral hypoglycemic drugs: Secondary | ICD-10-CM | POA: Diagnosis not present

## 2016-11-29 DIAGNOSIS — L03115 Cellulitis of right lower limb: Secondary | ICD-10-CM | POA: Diagnosis not present

## 2016-11-29 DIAGNOSIS — E039 Hypothyroidism, unspecified: Secondary | ICD-10-CM | POA: Diagnosis not present

## 2016-11-29 DIAGNOSIS — E119 Type 2 diabetes mellitus without complications: Secondary | ICD-10-CM | POA: Diagnosis not present

## 2016-12-03 DIAGNOSIS — D500 Iron deficiency anemia secondary to blood loss (chronic): Secondary | ICD-10-CM | POA: Diagnosis not present

## 2016-12-04 DIAGNOSIS — Z7984 Long term (current) use of oral hypoglycemic drugs: Secondary | ICD-10-CM | POA: Diagnosis not present

## 2016-12-04 DIAGNOSIS — C50911 Malignant neoplasm of unspecified site of right female breast: Secondary | ICD-10-CM | POA: Diagnosis not present

## 2016-12-04 DIAGNOSIS — L03116 Cellulitis of left lower limb: Secondary | ICD-10-CM | POA: Diagnosis not present

## 2016-12-04 DIAGNOSIS — L03115 Cellulitis of right lower limb: Secondary | ICD-10-CM | POA: Diagnosis not present

## 2016-12-04 DIAGNOSIS — E785 Hyperlipidemia, unspecified: Secondary | ICD-10-CM | POA: Diagnosis not present

## 2016-12-04 DIAGNOSIS — E039 Hypothyroidism, unspecified: Secondary | ICD-10-CM | POA: Diagnosis not present

## 2016-12-04 DIAGNOSIS — E119 Type 2 diabetes mellitus without complications: Secondary | ICD-10-CM | POA: Diagnosis not present

## 2016-12-05 DIAGNOSIS — Z7984 Long term (current) use of oral hypoglycemic drugs: Secondary | ICD-10-CM | POA: Diagnosis not present

## 2016-12-05 DIAGNOSIS — C50911 Malignant neoplasm of unspecified site of right female breast: Secondary | ICD-10-CM | POA: Diagnosis not present

## 2016-12-05 DIAGNOSIS — E039 Hypothyroidism, unspecified: Secondary | ICD-10-CM | POA: Diagnosis not present

## 2016-12-05 DIAGNOSIS — L03116 Cellulitis of left lower limb: Secondary | ICD-10-CM | POA: Diagnosis not present

## 2016-12-05 DIAGNOSIS — E785 Hyperlipidemia, unspecified: Secondary | ICD-10-CM | POA: Diagnosis not present

## 2016-12-05 DIAGNOSIS — L03115 Cellulitis of right lower limb: Secondary | ICD-10-CM | POA: Diagnosis not present

## 2016-12-05 DIAGNOSIS — E119 Type 2 diabetes mellitus without complications: Secondary | ICD-10-CM | POA: Diagnosis not present

## 2016-12-07 DIAGNOSIS — L03115 Cellulitis of right lower limb: Secondary | ICD-10-CM | POA: Diagnosis not present

## 2016-12-07 DIAGNOSIS — L03116 Cellulitis of left lower limb: Secondary | ICD-10-CM | POA: Diagnosis not present

## 2016-12-07 DIAGNOSIS — C50911 Malignant neoplasm of unspecified site of right female breast: Secondary | ICD-10-CM | POA: Diagnosis not present

## 2016-12-07 DIAGNOSIS — E039 Hypothyroidism, unspecified: Secondary | ICD-10-CM | POA: Diagnosis not present

## 2016-12-07 DIAGNOSIS — E119 Type 2 diabetes mellitus without complications: Secondary | ICD-10-CM | POA: Diagnosis not present

## 2016-12-07 DIAGNOSIS — E785 Hyperlipidemia, unspecified: Secondary | ICD-10-CM | POA: Diagnosis not present

## 2016-12-07 DIAGNOSIS — Z7984 Long term (current) use of oral hypoglycemic drugs: Secondary | ICD-10-CM | POA: Diagnosis not present

## 2016-12-14 DIAGNOSIS — E039 Hypothyroidism, unspecified: Secondary | ICD-10-CM | POA: Diagnosis not present

## 2016-12-14 DIAGNOSIS — Z7984 Long term (current) use of oral hypoglycemic drugs: Secondary | ICD-10-CM | POA: Diagnosis not present

## 2016-12-14 DIAGNOSIS — D500 Iron deficiency anemia secondary to blood loss (chronic): Secondary | ICD-10-CM | POA: Diagnosis not present

## 2016-12-14 DIAGNOSIS — Z853 Personal history of malignant neoplasm of breast: Secondary | ICD-10-CM | POA: Diagnosis not present

## 2016-12-14 DIAGNOSIS — E119 Type 2 diabetes mellitus without complications: Secondary | ICD-10-CM | POA: Diagnosis not present

## 2016-12-14 DIAGNOSIS — I10 Essential (primary) hypertension: Secondary | ICD-10-CM | POA: Diagnosis not present

## 2016-12-14 DIAGNOSIS — K922 Gastrointestinal hemorrhage, unspecified: Secondary | ICD-10-CM | POA: Diagnosis not present

## 2016-12-14 DIAGNOSIS — Z794 Long term (current) use of insulin: Secondary | ICD-10-CM | POA: Diagnosis not present

## 2016-12-14 DIAGNOSIS — K219 Gastro-esophageal reflux disease without esophagitis: Secondary | ICD-10-CM | POA: Diagnosis not present

## 2016-12-14 DIAGNOSIS — I8500 Esophageal varices without bleeding: Secondary | ICD-10-CM | POA: Diagnosis not present

## 2016-12-14 DIAGNOSIS — K644 Residual hemorrhoidal skin tags: Secondary | ICD-10-CM | POA: Diagnosis not present

## 2016-12-14 DIAGNOSIS — Z79899 Other long term (current) drug therapy: Secondary | ICD-10-CM | POA: Diagnosis not present

## 2016-12-14 DIAGNOSIS — K315 Obstruction of duodenum: Secondary | ICD-10-CM | POA: Diagnosis not present

## 2016-12-20 DIAGNOSIS — Z7901 Long term (current) use of anticoagulants: Secondary | ICD-10-CM | POA: Diagnosis not present

## 2016-12-20 DIAGNOSIS — I8501 Esophageal varices with bleeding: Secondary | ICD-10-CM | POA: Diagnosis not present

## 2016-12-20 DIAGNOSIS — D500 Iron deficiency anemia secondary to blood loss (chronic): Secondary | ICD-10-CM | POA: Diagnosis not present

## 2016-12-27 ENCOUNTER — Telehealth (INDEPENDENT_AMBULATORY_CARE_PROVIDER_SITE_OTHER): Payer: Self-pay | Admitting: Internal Medicine

## 2016-12-27 NOTE — Telephone Encounter (Signed)
I spoke with Amber at Dr. Joselyn Arrow office. I advised her we did not have any urgent openings for an esopahgeal banding. I recommended that she call [facility].  She stated "Thank u".

## 2017-01-03 ENCOUNTER — Other Ambulatory Visit: Payer: Self-pay

## 2017-01-03 ENCOUNTER — Encounter: Payer: Self-pay | Admitting: Gastroenterology

## 2017-01-03 ENCOUNTER — Ambulatory Visit (INDEPENDENT_AMBULATORY_CARE_PROVIDER_SITE_OTHER): Payer: Medicare Other | Admitting: Gastroenterology

## 2017-01-03 VITALS — BP 119/66 | HR 71 | Temp 97.3°F | Ht 65.0 in | Wt 181.8 lb

## 2017-01-03 DIAGNOSIS — K7460 Unspecified cirrhosis of liver: Secondary | ICD-10-CM

## 2017-01-03 DIAGNOSIS — R911 Solitary pulmonary nodule: Secondary | ICD-10-CM | POA: Diagnosis not present

## 2017-01-03 DIAGNOSIS — I8500 Esophageal varices without bleeding: Secondary | ICD-10-CM

## 2017-01-03 DIAGNOSIS — K315 Obstruction of duodenum: Secondary | ICD-10-CM | POA: Insufficient documentation

## 2017-01-03 DIAGNOSIS — I8510 Secondary esophageal varices without bleeding: Secondary | ICD-10-CM | POA: Diagnosis not present

## 2017-01-03 DIAGNOSIS — R197 Diarrhea, unspecified: Secondary | ICD-10-CM

## 2017-01-03 DIAGNOSIS — D509 Iron deficiency anemia, unspecified: Secondary | ICD-10-CM | POA: Diagnosis not present

## 2017-01-03 NOTE — Patient Instructions (Signed)
No PA is needed for CT SCAN or [place]

## 2017-01-03 NOTE — Assessment & Plan Note (Signed)
Recent diarrhea in setting of hospitalization and antibiotic use. Normal colonoscopy few weeks ago. ?antibiotic associated diarrhea. Patient reports normal stools for one week. Discussed that she is high risk for of C.diff, if recurrent persistent diarrhea would check cdiff pcr. Patient and husband voiced understanding.

## 2017-01-03 NOTE — Progress Notes (Signed)
LMOM to call back. Letter in the mail with appointments for [place] and CT SCAN. There are on 01/18/17 @ 9:15 am

## 2017-01-03 NOTE — Assessment & Plan Note (Signed)
IDA with recent need for blood transfusion. Underlying cirrhosis patient was unaware of but present on CT imaging in 2015. 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 Albumin low at 2.1 but otherwise unremarkable lfts. 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 Would recommend EGD with possible esophageal variceal banding if appropriate, duodenal stricture dilation. Plan for procedure in OR.  I have discussed the risks, alternatives, benefits with regards to but not limited to the risk of reaction to medication, bleeding, infection, perforation and the patient is agreeable to proceed. Written consent to be obtained.  We will update CBC at this time.   Colonoscopy normal per Dr. Ladona Horns recently.

## 2017-01-03 NOTE — Patient Instructions (Signed)
1. Upper endoscopy with Dr. Gala Romney as scheduled. See separate instructions.  2. I will review records from [facility] and make additional recommendations if needed.

## 2017-01-03 NOTE — Assessment & Plan Note (Signed)
Pulmonary nodule on prior Chest CT in 2015, suspicious for malignancy per radiology. No further work up done. Consider chest CT in near future to follow up.

## 2017-01-03 NOTE — Progress Notes (Signed)
Please let patient and husband know I have reviewed all records, labs, etc.  She needs Chest CT with contrast for pulmonary nodule Left upper lobe seen on CT done in 2015.  She needs Abd u/s for cirrhosis, hepatoma surveillance. She needs CBC, Met-7 in next 1-2 days.  EGD as scheduled.
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
## 2017-01-04 ENCOUNTER — Other Ambulatory Visit: Payer: Self-pay | Admitting: Gastroenterology

## 2017-01-04 ENCOUNTER — Other Ambulatory Visit: Payer: Self-pay

## 2017-01-04 DIAGNOSIS — K7460 Unspecified cirrhosis of liver: Secondary | ICD-10-CM

## 2017-01-04 NOTE — Progress Notes (Signed)
cc'ed to pcp °

## 2017-01-04 NOTE — Progress Notes (Signed)
Pt and her husband are aware of blood work, ct and U/S. Informed them of U/S and ct date and time and that Ginger mailed them a letter. Lab orders done and faxed to [facility] per pt request. The husband said he would take her and have them done.

## 2017-01-08 DIAGNOSIS — I872 Venous insufficiency (chronic) (peripheral): Secondary | ICD-10-CM | POA: Diagnosis not present

## 2017-01-10 DIAGNOSIS — Z833 Family history of diabetes mellitus: Secondary | ICD-10-CM | POA: Diagnosis not present

## 2017-01-10 DIAGNOSIS — Z79899 Other long term (current) drug therapy: Secondary | ICD-10-CM | POA: Diagnosis not present

## 2017-01-10 DIAGNOSIS — D638 Anemia in other chronic diseases classified elsewhere: Secondary | ICD-10-CM | POA: Diagnosis not present

## 2017-01-10 DIAGNOSIS — Z823 Family history of stroke: Secondary | ICD-10-CM | POA: Diagnosis not present

## 2017-01-10 DIAGNOSIS — E785 Hyperlipidemia, unspecified: Secondary | ICD-10-CM | POA: Diagnosis not present

## 2017-01-10 DIAGNOSIS — Z9221 Personal history of antineoplastic chemotherapy: Secondary | ICD-10-CM | POA: Diagnosis not present

## 2017-01-10 DIAGNOSIS — E119 Type 2 diabetes mellitus without complications: Secondary | ICD-10-CM | POA: Diagnosis not present

## 2017-01-10 DIAGNOSIS — Z923 Personal history of irradiation: Secondary | ICD-10-CM | POA: Diagnosis not present

## 2017-01-10 DIAGNOSIS — E039 Hypothyroidism, unspecified: Secondary | ICD-10-CM | POA: Diagnosis not present

## 2017-01-10 DIAGNOSIS — R062 Wheezing: Secondary | ICD-10-CM | POA: Diagnosis not present

## 2017-01-10 DIAGNOSIS — R188 Other ascites: Secondary | ICD-10-CM | POA: Diagnosis not present

## 2017-01-10 DIAGNOSIS — R0989 Other specified symptoms and signs involving the circulatory and respiratory systems: Secondary | ICD-10-CM | POA: Diagnosis not present

## 2017-01-10 DIAGNOSIS — I447 Left bundle-branch block, unspecified: Secondary | ICD-10-CM | POA: Diagnosis not present

## 2017-01-10 DIAGNOSIS — K7469 Other cirrhosis of liver: Secondary | ICD-10-CM | POA: Diagnosis not present

## 2017-01-10 DIAGNOSIS — K7460 Unspecified cirrhosis of liver: Secondary | ICD-10-CM | POA: Diagnosis not present

## 2017-01-10 DIAGNOSIS — Z8249 Family history of ischemic heart disease and other diseases of the circulatory system: Secondary | ICD-10-CM | POA: Diagnosis not present

## 2017-01-10 DIAGNOSIS — J984 Other disorders of lung: Secondary | ICD-10-CM | POA: Diagnosis not present

## 2017-01-10 DIAGNOSIS — R600 Localized edema: Secondary | ICD-10-CM | POA: Diagnosis not present

## 2017-01-10 DIAGNOSIS — R0602 Shortness of breath: Secondary | ICD-10-CM | POA: Diagnosis not present

## 2017-01-10 DIAGNOSIS — Z7984 Long term (current) use of oral hypoglycemic drugs: Secondary | ICD-10-CM | POA: Diagnosis not present

## 2017-01-10 DIAGNOSIS — E440 Moderate protein-calorie malnutrition: Secondary | ICD-10-CM | POA: Diagnosis not present

## 2017-01-10 DIAGNOSIS — D6959 Other secondary thrombocytopenia: Secondary | ICD-10-CM | POA: Diagnosis not present

## 2017-01-10 DIAGNOSIS — D649 Anemia, unspecified: Secondary | ICD-10-CM | POA: Diagnosis not present

## 2017-01-10 DIAGNOSIS — Z780 Asymptomatic menopausal state: Secondary | ICD-10-CM | POA: Diagnosis not present

## 2017-01-10 DIAGNOSIS — R05 Cough: Secondary | ICD-10-CM | POA: Diagnosis not present

## 2017-01-10 DIAGNOSIS — Z853 Personal history of malignant neoplasm of breast: Secondary | ICD-10-CM | POA: Diagnosis not present

## 2017-01-11 DIAGNOSIS — R188 Other ascites: Secondary | ICD-10-CM | POA: Diagnosis not present

## 2017-01-11 DIAGNOSIS — R161 Splenomegaly, not elsewhere classified: Secondary | ICD-10-CM | POA: Diagnosis not present

## 2017-01-11 DIAGNOSIS — E119 Type 2 diabetes mellitus without complications: Secondary | ICD-10-CM | POA: Diagnosis not present

## 2017-01-11 DIAGNOSIS — D638 Anemia in other chronic diseases classified elsewhere: Secondary | ICD-10-CM | POA: Diagnosis not present

## 2017-01-11 DIAGNOSIS — K7469 Other cirrhosis of liver: Secondary | ICD-10-CM | POA: Diagnosis not present

## 2017-01-12 DIAGNOSIS — K7469 Other cirrhosis of liver: Secondary | ICD-10-CM | POA: Diagnosis not present

## 2017-01-12 DIAGNOSIS — E119 Type 2 diabetes mellitus without complications: Secondary | ICD-10-CM | POA: Diagnosis not present

## 2017-01-12 DIAGNOSIS — R188 Other ascites: Secondary | ICD-10-CM | POA: Diagnosis not present

## 2017-01-12 DIAGNOSIS — D638 Anemia in other chronic diseases classified elsewhere: Secondary | ICD-10-CM | POA: Diagnosis not present

## 2017-01-18 ENCOUNTER — Ambulatory Visit (HOSPITAL_COMMUNITY): Admission: RE | Admit: 2017-01-18 | Payer: Medicare Other | Source: Ambulatory Visit

## 2017-01-25 ENCOUNTER — Telehealth (HOSPITAL_COMMUNITY): Payer: Self-pay | Admitting: Physical Therapy

## 2017-01-25 NOTE — Telephone Encounter (Signed)
DO NOT L/M one wound on right leg  -weeping no swelling per patient.Mailed letter to notify pt of this appointment since pt can not hear well. NF 01/25/17

## 2017-01-29 ENCOUNTER — Telehealth (HOSPITAL_COMMUNITY): Payer: Self-pay | Admitting: Internal Medicine

## 2017-01-29 DIAGNOSIS — I872 Venous insufficiency (chronic) (peripheral): Secondary | ICD-10-CM | POA: Diagnosis not present

## 2017-01-29 DIAGNOSIS — I10 Essential (primary) hypertension: Secondary | ICD-10-CM | POA: Diagnosis not present

## 2017-01-29 DIAGNOSIS — E119 Type 2 diabetes mellitus without complications: Secondary | ICD-10-CM | POA: Diagnosis not present

## 2017-01-29 NOTE — Telephone Encounter (Signed)
01/29/17 I called to see if patient wanted to move up to today appt.  I didn't leave a message but did get a call back and a gentleman said to leave it as is.

## 2017-01-30 ENCOUNTER — Ambulatory Visit (HOSPITAL_COMMUNITY): Payer: Medicare Other | Attending: Internal Medicine | Admitting: Physical Therapy

## 2017-01-30 DIAGNOSIS — R609 Edema, unspecified: Secondary | ICD-10-CM | POA: Diagnosis not present

## 2017-01-30 DIAGNOSIS — I872 Venous insufficiency (chronic) (peripheral): Secondary | ICD-10-CM | POA: Diagnosis not present

## 2017-01-30 DIAGNOSIS — L97316 Non-pressure chronic ulcer of right ankle with bone involvement without evidence of necrosis: Secondary | ICD-10-CM | POA: Diagnosis not present
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
## 2017-02-05 ENCOUNTER — Ambulatory Visit (HOSPITAL_COMMUNITY): Payer: Medicare Other | Admitting: Physical Therapy

## 2017-02-05 DIAGNOSIS — R609 Edema, unspecified: Secondary | ICD-10-CM | POA: Diagnosis not present

## 2017-02-05 DIAGNOSIS — I872 Venous insufficiency (chronic) (peripheral): Secondary | ICD-10-CM | POA: Diagnosis not present

## 2017-02-05 DIAGNOSIS — L97316 Non-pressure chronic ulcer of right ankle with bone involvement without evidence of necrosis: Secondary | ICD-10-CM | POA: Diagnosis not present

## 2017-02-05 NOTE — Patient Instructions (Signed)
Melissa Rose  02/05/2017     @PREFPERIOPPHARMACY @   Your procedure is scheduled on  02/13/2017   Report to [facility] at  [facility]  Call this number if you have problems the morning of surgery:  [phone]   Remember:  Do not eat food or drink liquids after midnight.  Take these medicines the morning of surgery with A SIP OF WATER Aricpt, levothyroxine, lisinopril, zantac, ultram.   Do not wear jewelry, make-up or nail polish.  Do not wear lotions, powders, or perfumes, or deoderant.  Do not shave 48 hours prior to surgery.  Men may shave face and neck.  Do not bring valuables to the hospital.  [facility] is not responsible for any belongings or valuables.  Contacts, dentures or bridgework may not be worn into surgery.  Leave your suitcase in the car.  After surgery it may be brought to your room.  For patients admitted to the hospital, discharge time will be determined by your treatment team.  Patients discharged the day of surgery will not be allowed to drive home.   Name and phone number of your driver:   family Special instructions:  Follow the diet instructions given to you by Dr Roseanne Kaufman office.  Please read over the following fact sheets that you were given. Anesthesia Post-op Instructions and Care and Recovery After Surgery       Esophagogastroduodenoscopy Esophagogastroduodenoscopy (EGD) is a procedure to examine the lining of the esophagus, stomach, and first part of the small intestine (duodenum). This procedure is done to check for problems such as inflammation, bleeding, ulcers, or growths. During this procedure, a long, flexible, lighted tube with a camera attached (endoscope) is inserted down the throat. Tell a health care provider about:  Any allergies you have.  All medicines you are taking, including vitamins, herbs, eye drops, creams, and over-the-counter medicines.  Any problems you or family members have had with anesthetic  medicines.  Any blood disorders you have.  Any surgeries you have had.  Any medical conditions you have.  Whether you are pregnant or may be pregnant. What are the risks? Generally, this is a safe procedure. However, problems may occur, including:  Infection.  Bleeding.  A tear (perforation) in the esophagus, stomach, or duodenum.  Trouble breathing.  Excessive sweating.  Spasms of the larynx.  A slowed heartbeat.  Low blood pressure. What happens before the procedure?  Follow instructions from your health care provider about eating or drinking restrictions.  Ask your health care provider about:  Changing or stopping your regular medicines. This is especially important if you are taking diabetes medicines or blood thinners.  Taking medicines such as aspirin and ibuprofen. These medicines can thin your blood. Do not take these medicines before your procedure if your health care provider instructs you not to.  Plan to have someone take you home after the procedure.  If you wear dentures, be ready to remove them before the procedure. What happens during the procedure?  To reduce your risk of infection, your health care team will wash or sanitize their hands.  An IV tube will be put in a vein in your hand or arm. You will get medicines and fluids through this tube.  You will be given one or more of the following:  A medicine to help you relax (sedative).  A medicine to numb the area (local anesthetic). This medicine may be sprayed into your throat.  It will make you feel more comfortable and keep you from gagging or coughing during the procedure.  A medicine for pain.  A mouth guard may be placed in your mouth to protect your teeth and to keep you from biting on the endoscope.  You will be asked to lie on your left side.  The endoscope will be lowered down your throat into your esophagus, stomach, and duodenum.  Air will be put into the endoscope. This will help  your health care provider see better.  The lining of your esophagus, stomach, and duodenum will be examined.  Your health care provider may:  Take a tissue sample so it can be looked at in a lab (biopsy).  Remove growths.  Remove objects (foreign bodies) that are stuck.  Treat any bleeding with medicines or other devices that stop tissue from bleeding.  Widen (dilate) or stretch narrowed areas of your esophagus and stomach.  The endoscope will be taken out. The procedure may vary among health care providers and hospitals. What happens after the procedure?  Your blood pressure, heart rate, breathing rate, and blood oxygen level will be monitored often until the medicines you were given have worn off.  Do not eat or drink anything until the numbing medicine has worn off and your gag reflex has returned. This information is not intended to replace advice given to you by your health care provider. Make sure you discuss any questions you have with your health care provider. Document Released: 01/11/2005 Document Revised: 02/16/2016 Document Reviewed: 08/04/2015 Elsevier Interactive Patient Education  2017 [place]. Esophagogastroduodenoscopy, Care After Refer to this sheet in the next few weeks. These instructions provide you with information about caring for yourself after your procedure. Your health care provider may also give you more specific instructions. Your treatment has been planned according to current medical practices, but problems sometimes occur. Call your health care provider if you have any problems or questions after your procedure. What can I expect after the procedure? After the procedure, it is common to have:  A sore throat.  Nausea.  Bloating.  Dizziness.  Fatigue. Follow these instructions at home:  Do not eat or drink anything until the numbing medicine (local anesthetic) has worn off and your gag reflex has returned. You will know that the local  anesthetic has worn off when you can swallow comfortably.  Do not drive for 24 hours if you received a medicine to help you relax (sedative).  If your health care provider took a tissue sample for testing during the procedure, make sure to get your test results. This is your responsibility. Ask your health care provider or the department performing the test when your results will be ready.  Keep all follow-up visits as told by your health care provider. This is important. Contact a health care provider if:  You cannot stop coughing.  You are not urinating.  You are urinating less than usual. Get help right away if:  You have trouble swallowing.  You cannot eat or drink.  You have throat or chest pain that gets worse.  You are dizzy or light-headed.  You faint.  You have nausea or vomiting.  You have chills.  You have a fever.  You have severe abdominal pain.  You have black, tarry, or bloody stools. This information is not intended to replace advice given to you by your health care provider. Make sure you discuss any questions you have with your health care provider. Document Released:  08/27/2012 Document Revised: 02/16/2016 Document Reviewed: 08/04/2015 Elsevier Interactive Patient Education  2017 [place] Anesthesia is a term that refers to techniques, procedures, and medicines that help a person stay safe and comfortable during a medical procedure. Monitored anesthesia care, or sedation, is one type of anesthesia. Your anesthesia specialist may recommend sedation if you will be having a procedure that does not require you to be unconscious, such as:  Cataract surgery.  A dental procedure.  A biopsy.  A colonoscopy. During the procedure, you may receive a medicine to help you relax (sedative). There are three levels of sedation:  Mild sedation. At this level, you may feel awake and relaxed. You will be able to follow  directions.  Moderate sedation. At this level, you will be sleepy. You may not remember the procedure.  Deep sedation. At this level, you will be asleep. You will not remember the procedure. The more medicine you are given, the deeper your level of sedation will be. Depending on how you respond to the procedure, the anesthesia specialist may change your level of sedation or the type of anesthesia to fit your needs. An anesthesia specialist will monitor you closely during the procedure. Let your health care provider know about:  Any allergies you have.  All medicines you are taking, including vitamins, herbs, eye drops, creams, and over-the-counter medicines.  Any use of steroids (by mouth or as a cream).  Any problems you or family members have had with sedatives and anesthetic medicines.  Any blood disorders you have.  Any surgeries you have had.  Any medical conditions you have, such as sleep apnea.  Whether you are pregnant or may be pregnant.  Any use of cigarettes, alcohol, or street drugs. What are the risks? Generally, this is a safe procedure. However, problems may occur, including:  Getting too much medicine (oversedation).  Nausea.  Allergic reaction to medicines.  Trouble breathing. If this happens, a breathing tube may be used to help with breathing. It will be removed when you are awake and breathing on your own.  Heart trouble.  Lung trouble. Before the procedure Staying hydrated  Follow instructions from your health care provider about hydration, which may include:  Up to 2 hours before the procedure - you may continue to drink clear liquids, such as water, clear fruit juice, black coffee, and plain tea. Eating and drinking restrictions  Follow instructions from your health care provider about eating and drinking, which may include:  8 hours before the procedure - stop eating heavy meals or foods such as meat, fried foods, or fatty foods.  6 hours  before the procedure - stop eating light meals or foods, such as toast or cereal.  6 hours before the procedure - stop drinking milk or drinks that contain milk.  2 hours before the procedure - stop drinking clear liquids. Medicines  Ask your health care provider about:  Changing or stopping your regular medicines. This is especially important if you are taking diabetes medicines or blood thinners.  Taking medicines such as aspirin and ibuprofen. These medicines can thin your blood. Do not take these medicines before your procedure if your health care provider instructs you not to. Tests and exams  You will have a physical exam.  You may have blood tests done to show:  How well your kidneys and liver are working.  How well your blood can clot.  General instructions  Plan to have someone take you home from  the hospital or clinic.  If you will be going home right after the procedure, plan to have someone with you for 24 hours. What happens during the procedure?  Your blood pressure, heart rate, breathing, level of pain and overall condition will be monitored.  An IV tube will be inserted into one of your veins.  Your anesthesia specialist will give you medicines as needed to keep you comfortable during the procedure. This may mean changing the level of sedation.  The procedure will be performed. After the procedure  Your blood pressure, heart rate, breathing rate, and blood oxygen level will be monitored until the medicines you were given have worn off.  Do not drive for 24 hours if you received a sedative.  You may:  Feel sleepy, clumsy, or nauseous.  Feel forgetful about what happened after the procedure.  Have a sore throat if you had a breathing tube during the procedure.  Vomit. This information is not intended to replace advice given to you by your health care provider. Make sure you discuss any questions you have with your health care provider. Document  Released: 06/06/2005 Document Revised: 02/17/2016 Document Reviewed: 01/01/2016 Elsevier Interactive Patient Education  2017 [place], Care After These instructions provide you with information about caring for yourself after your procedure. Your health care provider may also give you more specific instructions. Your treatment has been planned according to current medical practices, but problems sometimes occur. Call your health care provider if you have any problems or questions after your procedure. What can I expect after the procedure? After your procedure, it is common to:  Feel sleepy for several hours.  Feel clumsy and have poor balance for several hours.  Feel forgetful about what happened after the procedure.  Have poor judgment for several hours.  Feel nauseous or vomit.  Have a sore throat if you had a breathing tube during the procedure. Follow these instructions at home: For at least 24 hours after the procedure:    Do not:  Participate in activities in which you could fall or become injured.  Drive.  Use heavy machinery.  Drink alcohol.  Take sleeping pills or medicines that cause drowsiness.  Make important decisions or sign legal documents.  Take care of children on your own.  Rest. Eating and drinking   Follow the diet that is recommended by your health care provider.  If you vomit, drink water, juice, or soup when you can drink without vomiting.  Make sure you have little or no nausea before eating solid foods. General instructions   Have a responsible adult stay with you until you are awake and alert.  Take over-the-counter and prescription medicines only as told by your health care provider.  If you smoke, do not smoke without supervision.  Keep all follow-up visits as told by your health care provider. This is important. Contact a health care provider if:  You keep feeling nauseous or you keep  vomiting.  You feel light-headed.  You develop a rash.  You have a fever. Get help right away if:  You have trouble breathing. This information is not intended to replace advice given to you by your health care provider. Make sure you discuss any questions you have with your health care provider. Document Released: 01/01/2016 Document Revised: 05/02/2016 Document Reviewed: 01/01/2016 Elsevier Interactive Patient Education  2017 [employer].
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
## 2017-02-05 NOTE — Pre-Procedure Instructions (Signed)
Spoke with Neil Crouch, PA because note and consent did not match booking. Consent corrected per her order.

## 2017-02-06 ENCOUNTER — Ambulatory Visit (HOSPITAL_COMMUNITY): Payer: Medicare Other | Admitting: Physical Therapy

## 2017-02-08 ENCOUNTER — Encounter (HOSPITAL_COMMUNITY)
Admission: RE | Admit: 2017-02-08 | Discharge: 2017-02-08 | Disposition: A | Discharge status: Home / Self Care | Payer: Medicare Other | Source: Ambulatory Visit | Attending: Internal Medicine | Admitting: Internal Medicine

## 2017-02-08 ENCOUNTER — Ambulatory Visit (HOSPITAL_COMMUNITY): Payer: Medicare Other | Admitting: Physical Therapy

## 2017-02-08 ENCOUNTER — Other Ambulatory Visit: Payer: Self-pay

## 2017-02-08 ENCOUNTER — Encounter (HOSPITAL_COMMUNITY): Payer: Self-pay

## 2017-02-08 DIAGNOSIS — I872 Venous insufficiency (chronic) (peripheral): Secondary | ICD-10-CM | POA: Diagnosis not present

## 2017-02-08 DIAGNOSIS — L97316 Non-pressure chronic ulcer of right ankle with bone involvement without evidence of necrosis: Secondary | ICD-10-CM | POA: Diagnosis not present

## 2017-02-08 DIAGNOSIS — Z01810 Encounter for preprocedural cardiovascular examination: Secondary | ICD-10-CM | POA: Insufficient documentation

## 2017-02-08 DIAGNOSIS — R609 Edema, unspecified: Secondary | ICD-10-CM | POA: Diagnosis not present

## 2017-02-08 DIAGNOSIS — Z01812 Encounter for preprocedural laboratory examination: Secondary | ICD-10-CM | POA: Insufficient documentation

## 2017-02-08 DIAGNOSIS — R9431 Abnormal electrocardiogram [ECG] [EKG]: Secondary | ICD-10-CM | POA: Diagnosis not present

## 2017-02-08 LAB — BASIC METABOLIC PANEL
ANION GAP: 4 — AB (ref 5–15)
BUN: 13 mg/dL (ref 6–20)
CHLORIDE: 107 mmol/L (ref 101–111)
CO2: 23 mmol/L (ref 22–32)
Calcium: 8.5 mg/dL — ABNORMAL LOW (ref 8.9–10.3)
Creatinine, Ser: 1.23 mg/dL — ABNORMAL HIGH (ref 0.44–1.00)
GFR calc Af Amer: 49 mL/min — ABNORMAL LOW (ref >60)
GFR calc non Af Amer: 42 mL/min — ABNORMAL LOW (ref >60)
Glucose, Bld: 224 mg/dL — ABNORMAL HIGH (ref 65–99)
POTASSIUM: 3.8 mmol/L (ref 3.5–5.1)
Sodium: 134 mmol/L — ABNORMAL LOW (ref 135–145)

## 2017-02-08 LAB — SURGICAL PCR SCREEN
MRSA, PCR: POSITIVE — AB
Staphylococcus aureus: POSITIVE — AB

## 2017-02-08 LAB — CBC WITH DIFFERENTIAL/PLATELET
Basophils Absolute: 0.1 10*3/uL (ref 0.0–0.1)
Basophils Relative: 3 %
Eosinophils Absolute: 0.3 10*3/uL (ref 0.0–0.7)
Eosinophils Relative: 7 %
HEMATOCRIT: 26.7 % — AB (ref 36.0–46.0)
Hemoglobin: 8.6 g/dL — ABNORMAL LOW (ref 12.0–15.0)
LYMPHS ABS: 0.8 10*3/uL (ref 0.7–4.0)
LYMPHS PCT: 20 %
MCH: 26.2 pg (ref 26.0–34.0)
MCHC: 32.2 g/dL (ref 30.0–36.0)
MCV: 81.4 fL (ref 78.0–100.0)
MONO ABS: 0.6 10*3/uL (ref 0.1–1.0)
Monocytes Relative: 15 %
NEUTROS ABS: 2.3 10*3/uL (ref 1.7–7.7)
Neutrophils Relative %: 55 %
Platelets: 109 10*3/uL — ABNORMAL LOW (ref 150–400)
RBC: 3.28 MIL/uL — AB (ref 3.87–5.11)
RDW: 16.2 % — ABNORMAL HIGH (ref 11.5–15.5)
WBC: 4.1 10*3/uL (ref 4.0–10.5)
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
## 2017-02-11 ENCOUNTER — Telehealth: Payer: Self-pay

## 2017-02-11 ENCOUNTER — Ambulatory Visit (HOSPITAL_COMMUNITY): Payer: Medicare Other | Admitting: Physical Therapy

## 2017-02-11 DIAGNOSIS — I872 Venous insufficiency (chronic) (peripheral): Secondary | ICD-10-CM | POA: Diagnosis not present

## 2017-02-11 DIAGNOSIS — L97316 Non-pressure chronic ulcer of right ankle with bone involvement without evidence of necrosis: Secondary | ICD-10-CM | POA: Diagnosis not present

## 2017-02-11 DIAGNOSIS — R609 Edema, unspecified: Secondary | ICD-10-CM

## 2017-02-11 NOTE — Progress Notes (Signed)
Ginger at Dr Roseanne Kaufman office and Dr Patsey Berthold notified of Hem 8.6 Hct 26.7 and [address].  Dr Patsey Berthold orderedType and Screen on arrival day of procedure.

## 2017-02-11 NOTE — Telephone Encounter (Signed)
Noted. Dr. Patsey Berthold is planning for type and screen day of procedures.  Hgb 8.6 02/08/17. Was in low 9 range at [facility] 10/2016.   H/o IDA, cirrhosis.   Procedures as planned for Wednesday.

## 2017-02-11 NOTE — Telephone Encounter (Signed)
Melissa Rose from day surgery called about her HGB. Please see progress notes
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
## 2017-02-13 ENCOUNTER — Ambulatory Visit (HOSPITAL_COMMUNITY): Payer: Medicare Other | Admitting: Anesthesiology

## 2017-02-13 ENCOUNTER — Ambulatory Visit (HOSPITAL_COMMUNITY)
Admission: RE | Admit: 2017-02-13 | Discharge: 2017-02-13 | Disposition: A | Discharge status: Home / Self Care | Payer: Medicare Other | Source: Ambulatory Visit | Attending: Internal Medicine | Admitting: Internal Medicine

## 2017-02-13 ENCOUNTER — Telehealth: Payer: Self-pay | Admitting: Internal Medicine

## 2017-02-13 ENCOUNTER — Encounter (HOSPITAL_COMMUNITY)
Admission: RE | Disposition: A | Discharge status: Home / Self Care | Payer: Self-pay | Source: Ambulatory Visit | Attending: Internal Medicine

## 2017-02-13 ENCOUNTER — Encounter (HOSPITAL_COMMUNITY): Payer: Self-pay | Admitting: Anesthesiology

## 2017-02-13 DIAGNOSIS — D731 Hypersplenism: Secondary | ICD-10-CM | POA: Insufficient documentation

## 2017-02-13 DIAGNOSIS — E785 Hyperlipidemia, unspecified: Secondary | ICD-10-CM | POA: Diagnosis not present

## 2017-02-13 DIAGNOSIS — K7460 Unspecified cirrhosis of liver: Secondary | ICD-10-CM | POA: Insufficient documentation

## 2017-02-13 DIAGNOSIS — Z853 Personal history of malignant neoplasm of breast: Secondary | ICD-10-CM | POA: Diagnosis not present

## 2017-02-13 DIAGNOSIS — I8500 Esophageal varices without bleeding: Secondary | ICD-10-CM

## 2017-02-13 DIAGNOSIS — K315 Obstruction of duodenum: Secondary | ICD-10-CM | POA: Diagnosis not present

## 2017-02-13 DIAGNOSIS — E039 Hypothyroidism, unspecified: Secondary | ICD-10-CM | POA: Diagnosis not present

## 2017-02-13 DIAGNOSIS — K3189 Other diseases of stomach and duodenum: Secondary | ICD-10-CM | POA: Insufficient documentation

## 2017-02-13 DIAGNOSIS — K766 Portal hypertension: Secondary | ICD-10-CM | POA: Diagnosis not present

## 2017-02-13 DIAGNOSIS — E119 Type 2 diabetes mellitus without complications: Secondary | ICD-10-CM | POA: Diagnosis not present

## 2017-02-13 DIAGNOSIS — Z79899 Other long term (current) drug therapy: Secondary | ICD-10-CM | POA: Diagnosis not present

## 2017-02-13 DIAGNOSIS — Z7984 Long term (current) use of oral hypoglycemic drugs: Secondary | ICD-10-CM | POA: Insufficient documentation

## 2017-02-13 DIAGNOSIS — D509 Iron deficiency anemia, unspecified: Secondary | ICD-10-CM

## 2017-02-13 DIAGNOSIS — I10 Essential (primary) hypertension: Secondary | ICD-10-CM | POA: Insufficient documentation

## 2017-02-13 DIAGNOSIS — K228 Other specified diseases of esophagus: Secondary | ICD-10-CM

## 2017-02-13 DIAGNOSIS — I8501 Esophageal varices with bleeding: Secondary | ICD-10-CM | POA: Diagnosis not present

## 2017-02-13 HISTORY — PX: ESOPHAGEAL BANDING: SHX5518

## 2017-02-13 HISTORY — PX: ESOPHAGOGASTRODUODENOSCOPY (EGD) WITH PROPOFOL: SHX5813

## 2017-02-13 LAB — GLUCOSE, CAPILLARY
GLUCOSE-CAPILLARY: 69 mg/dL (ref 65–99)
GLUCOSE-CAPILLARY: 112 mg/dL — AB (ref 65–99)
Glucose-Capillary: 72 mg/dL (ref 65–99)
Glucose-Capillary: 84 mg/dL (ref 65–99)

## 2017-02-13 SURGERY — ESOPHAGOGASTRODUODENOSCOPY (EGD) WITH PROPOFOL
Anesthesia: Monitor Anesthesia Care

## 2017-02-13 MED ORDER — LACTATED RINGERS IV SOLN
INTRAVENOUS | Status: DC
  Administered 2017-02-13: 14:00:00 via INTRAVENOUS

## 2017-02-13 MED ORDER — LIDOCAINE VISCOUS 2 % MT SOLN
OROMUCOSAL | Status: DC | PRN
Start: 2017-02-13 — End: 2017-02-13
  Administered 2017-02-13: 6 mL via OROMUCOSAL

## 2017-02-13 MED ORDER — DEXTROSE 50 % IV SOLN
INTRAVENOUS | Status: AC
  Filled 2017-02-13: qty 50

## 2017-02-13 MED ORDER — PROPOFOL 500 MG/50ML IV EMUL
INTRAVENOUS | Status: DC | PRN
  Administered 2017-02-13: 150 ug/kg/min via INTRAVENOUS

## 2017-02-13 MED ORDER — CHLORHEXIDINE GLUCONATE CLOTH 2 % EX PADS: 6.0000 | MEDICATED_PAD | Freq: Once | CUTANEOUS | Status: DC

## 2017-02-13 MED ORDER — PROPOFOL 10 MG/ML IV BOLUS
INTRAVENOUS | Status: AC
  Filled 2017-02-13: qty 40

## 2017-02-13 MED ORDER — LIDOCAINE VISCOUS 2 % MT SOLN
OROMUCOSAL | Status: AC
  Filled 2017-02-13: qty 15

## 2017-02-13 MED ORDER — MUPIROCIN 2 % EX OINT
TOPICAL_OINTMENT | CUTANEOUS | Status: AC
  Filled 2017-02-13: qty 22

## 2017-02-13 MED ORDER — MIDAZOLAM HCL 2 MG/2ML IJ SOLN
1.0000 mg | INTRAMUSCULAR | Status: AC
  Administered 2017-02-13: 2 mg via INTRAVENOUS

## 2017-02-13 MED ORDER — MIDAZOLAM HCL 2 MG/2ML IJ SOLN
INTRAMUSCULAR | Status: AC
  Filled 2017-02-13: qty 2

## 2017-02-13 MED ORDER — MUPIROCIN 2 % EX OINT
1.0000 "application " | TOPICAL_OINTMENT | Freq: Once | CUTANEOUS | Status: AC
  Administered 2017-02-13: 1 via TOPICAL

## 2017-02-13 MED ORDER — DEXTROSE 50 % IV SOLN
12.5000 g | Freq: Once | INTRAVENOUS | Status: AC
  Administered 2017-02-13: 12.5 g via INTRAVENOUS

## 2017-02-13 MED ORDER — CHLORHEXIDINE GLUCONATE CLOTH 2 % EX PADS
6.0000 | MEDICATED_PAD | Freq: Once | CUTANEOUS | Status: DC
Start: 2017-02-13 — End: 2017-02-13

## 2017-02-13 NOTE — Anesthesia Preprocedure Evaluation (Signed)
Anesthesia Evaluation  Patient identified by MRN, date of birth, ID band Patient awake  General Assessment Comment:Somewhat weak and lethargic, but responds appropriately.  Reviewed: Allergy & Precautions, NPO status , Patient's Chart, lab work & pertinent test results  Airway Mallampati: II  TM Distance: >3 FB     Dental  (+) Partial Lower, Partial Upper   Pulmonary neg pulmonary ROS,  pulm nodules   breath sounds clear to auscultation       Cardiovascular hypertension, Pt. on medications and Pt. 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)  Esophageal Varices    ,   Endo/Other  diabetes, Type 2Hypothyroidism   Renal/GU      Musculoskeletal   Abdominal   Peds  Hematology  (+) anemia ,   Anesthesia Other Findings Breast Cancer   Reproductive/Obstetrics                             Anesthesia Physical Anesthesia Plan  ASA: III  Anesthesia Plan: MAC   Post-op Pain Management:    Induction: Intravenous  Airway Management Planned: Simple Face Mask  Additional Equipment:   Intra-op Plan:   Post-operative Plan:   Informed Consent: I have reviewed the patients History and Physical, chart, labs and discussed the procedure including the risks, benefits and alternatives for the proposed anesthesia with the patient or authorized representative who has indicated his/her understanding and acceptance.     Plan Discussed with:   Anesthesia Plan Comments:         Anesthesia Quick Evaluation

## 2017-02-13 NOTE — H&P (Signed)
@LOGO @   Primary Care Physician:  Neale Burly, MD Primary Gastroenterologist:  Dr. Gala Romney  Pre-Procedure History & Physical: HPI:  Melissa Rose is a 76 y.o. female here for reassessment esophageal varices and no duodenal stricture. Patient also has a suspicious pulmonary nodule for which she has not had follow-up imaging as of yet although it has been ordered.  Past Medical History:  Diagnosis Date  . Breast cancer ([place]) 2000   treated with chemo/radiation  . Diabetes mellitus without complication ([place])   . Hyperlipidemia   . Hypertension   . Hypothyroidism     Past Surgical History:  Procedure Laterality Date  . BREAST SURGERY Right   . 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 COLONOSCOPY  11/2016   Dr. Ladona Horns: normal  . ESOPHAGOGASTRODUODENOSCOPY  11/2016   Dr. 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    Prior to Admission medications   Medication Sig Start Date End Date Taking? Authorizing Provider  atorvastatin (LIPITOR) 10 MG tablet Take 10 mg by mouth at bedtime.  11/02/16  Yes [provider]  Calcium Carb-Cholecalciferol (CALCIUM 600/VITAMIN D3) 600-800 MG-UNIT TABS Take 1 tablet by mouth daily.   Yes [provider]  donepezil (ARICEPT) 10 MG tablet Take 10 mg by mouth every evening.  11/02/16  Yes [provider]  furosemide (LASIX) 20 MG tablet Take 20 mg by mouth daily.   Yes [provider]  glipiZIDE (GLUCOTROL) 5 MG tablet Take 5 mg by mouth 2 (two) times daily. 04/27/14  Yes [provider]  lactulose (CHRONULAC) 10 GM/15ML solution Take 10 g by mouth 2 (two) times daily.   Yes [provider]  levothyroxine (SYNTHROID, LEVOTHROID) 100 MCG tablet Take 100 mcg by mouth daily.  11/02/16  Yes [provider]  lisinopril (PRINIVIL,ZESTRIL) 10  MG tablet Take 10 mg by mouth daily. 04/27/14  Yes [provider]  metFORMIN (GLUCOPHAGE) 1000 MG tablet Take 1,000 mg by mouth 2 (two) times daily.   Yes [provider]  propranolol (INDERAL) 10 MG tablet Take 10 mg by mouth 2 (two) times daily.   Yes [provider]  ranitidine (ZANTAC) 300 MG tablet Take 300 mg by mouth 2 (two) times daily.  11/02/16  Yes [provider]  spironolactone (ALDACTONE) 25 MG tablet Take 12.5 mg by mouth 2 (two) times daily.   Yes [provider]    Allergies as of 01/03/2017  . (No Known Allergies)    Family History  Problem Relation Age of Onset  . Diabetes Mother   . Diabetes Father   . Heart disease Father   . Cerebral palsy Sister   . Liver disease Neg Hx   . Colon cancer Neg Hx     Social History   Social History  . Marital status: Married    Spouse name: N/A  . Number of children: N/A  . Years of education: N/A   Occupational History  . Not on file.   Social History Main Topics  . Smoking status: Never Smoker  . Smokeless tobacco: Never Used  . Alcohol use No  . Drug use: No  . Sexual activity: Not on file   Other Topics Concern  . Not on file   Social History Narrative  . No narrative on file    Review of  Systems: See HPI, otherwise negative ROS  Physical Exam: There were no vitals taken for this visit. General:   Alert,  , pleasant and cooperative in NAD Neck:  Supple; no masses or thyromegaly. No significant cervical adenopathy. Lungs:  Clear throughout to auscultation.   No wheezes, crackles, or rhonchi. No acute distress. Heart:  Regular rate and rhythm; no murmurs, clicks, rubs,  or gallops. Abdomen: Non-distended, normal bowel sounds.  Soft and nontender without appreciable mass or hepatosplenomegaly.  Pulses:  Normal pulses noted. Extremities:  Without clubbing or edema.  Impression:  Pleasant 76 year old lady with known cirrhosis and esophageal varices. Needs  reassessment of varices and possible banding as feasible/Procrit as discussed with patient and her husband. Pulmonary nodule needs follow-ups a separate issue.   Recommendations:  EGD with possible EBL, etc. as feasible/appropriate per plan.  The risks, benefits, limitations, alternatives and imponderables have been reviewed with the patient. Potential for esophageal dilation, biopsy, etc. have also been reviewed.  Questions have been answered. All parties agreeable.                                Notice: This dictation was prepared with Dragon dictation along with smaller phrase technology. Any transcriptional errors that result from this process are unintentional and may not be corrected upon review.

## 2017-02-13 NOTE — Anesthesia Procedure Notes (Signed)
Procedure Name: MAC Date/Time: 02/13/2017 1:54 PM Performed by: Andree Elk, AMY A Pre-anesthesia Checklist: Patient identified, Emergency Drugs available, Suction available, Patient being monitored and Timeout performed Oxygen Delivery Method: Simple face mask

## 2017-02-13 NOTE — Transfer of Care (Signed)
Immediate Anesthesia Transfer of Care Note  Patient: Melissa Rose  Procedure(s) Performed: Procedure(s) with comments: ESOPHAGOGASTRODUODENOSCOPY (EGD) WITH PROPOFOL (N/A) - 145  ESOPHAGEAL BANDING (N/A)  Patient Location: PACU  Anesthesia Type:MAC  Level of Consciousness: awake, drowsy and patient cooperative  Airway & Oxygen Therapy: Patient Spontanous Breathing and Patient connected to nasal cannula oxygen  Post-op Assessment: Report given to RN and Post -op Vital signs reviewed and stable  Post vital signs: Reviewed and stable  Last Vitals:  Vitals:   02/13/17 1339 02/13/17 1350  BP: (!) 110/49   Pulse:    Resp: (!) 26 17  Temp:      Last Pain:  Vitals:   02/13/17 1240  TempSrc: Oral      Patients Stated Pain Goal: 6 (33/74/45 1460)  Complications: No apparent anesthesia complications

## 2017-02-13 NOTE — Discharge Instructions (Addendum)
EGD Discharge instructions Please read the instructions outlined below and refer to this sheet in the next few weeks. These discharge instructions provide you with general information on caring for yourself after you leave the hospital. Your doctor may also give you specific instructions. While your treatment has been planned according to the most current medical practices available, unavoidable complications occasionally occur. If you have any problems or questions after discharge, please call your doctor. ACTIVITY  You may resume your regular activity but move at a slower pace for the next 24 hours.   Take frequent rest periods for the next 24 hours.   Walking will help expel (get rid of) the air and reduce the bloated feeling in your abdomen.   No driving for 24 hours (because of the anesthesia (medicine) used during the test).   You may shower.   Do not sign any important legal documents or operate any machinery for 24 hours (because of the anesthesia used during the test).  NUTRITION  Drink plenty of fluids.   You may resume your normal diet.   Begin with a light meal and progress to your normal diet.   Avoid alcoholic beverages for 24 hours or as instructed by your caregiver.  MEDICATIONS  You may resume your normal medications unless your caregiver tells you otherwise.  WHAT YOU CAN EXPECT TODAY  You may experience abdominal discomfort such as a feeling of fullness or gas pains.  FOLLOW-UP  Your doctor will discuss the results of your test with you.  SEEK IMMEDIATE MEDICAL ATTENTION IF ANY OF THE FOLLOWING OCCUR:  Excessive nausea (feeling sick to your stomach) and/or vomiting.   Severe abdominal pain and distention (swelling).   Trouble swallowing.   Temperature over 101 F (37.8 C).   Rectal bleeding or vomiting of blood.    Full liquid diet 2 days then advance as tolerated  Chloraseptic spray for a transient sore throat or upper chest pain you may  experience over the next couple days  Office visit with [place] in 1 month to set up a repeat EGD with banding  Follow through in getting a chest CT to reassess abnormal pulmonary nodule.

## 2017-02-13 NOTE — Anesthesia Postprocedure Evaluation (Signed)
Anesthesia Post Note  Patient: Melissa Rose  Procedure(s) Performed: Procedure(s) (LRB): ESOPHAGOGASTRODUODENOSCOPY (EGD) WITH PROPOFOL (N/A) ESOPHAGEAL BANDING (N/A)  Patient location during evaluation: PACU Anesthesia Type: MAC Level of consciousness: awake and alert, oriented and patient cooperative Pain management: pain level controlled Respiratory status: spontaneous breathing and respiratory function stable Cardiovascular status: stable Postop Assessment: no signs of nausea or vomiting Anesthetic complications: no     Last Vitals:  Vitals:   02/13/17 1350 02/13/17 1430  BP:  131/62  Pulse:  (!) 57  Resp: 17 20  Temp:  36.4 C    Last Pain:  Vitals:   02/13/17 1430  TempSrc:   PainSc: Asleep                 Anayansi Rundquist A

## 2017-02-13 NOTE — Op Note (Signed)
[facility] Patient Name: Melissa Rose Procedure Date: 02/13/2017 12:59 PM MRN: [id number] Date of Birth: 06-18-1941 Attending MD: Norvel Richards , MD CSN: [id number] Age: 76 Admit Type: Outpatient Procedure:                Upper GI endoscopy Indications:              Screening procedure, Suspected upper                            gastrointestinal bleeding in patient with                            unexplained iron deficiency anemia Providers:                Norvel Richards, MD, Otis Peak B. Sharon Seller, RN,                            Aram Candela Referring MD:             Stoney Bang MD, MD Medicines:                Propofol per Anesthesia Complications:            No immediate complications. Estimated Blood Loss:     Estimated blood loss was minimal. Procedure:                Pre-Anesthesia Assessment:                           - Prior to the procedure, a History and Physical                            was performed, and patient medications and                            allergies were reviewed. The patient's tolerance of                            previous anesthesia was also reviewed. The risks                            and benefits of the procedure and the sedation                            options and risks were discussed with the patient.                            All questions were answered, and informed consent                            was obtained. Prior Anticoagulants: The patient has                            taken no previous anticoagulant or antiplatelet  agents. ASA Grade Assessment: III - A patient with                            severe systemic disease. After reviewing the risks                            and benefits, the patient was deemed in                            satisfactory condition to undergo the procedure.                           After obtaining informed consent, the endoscope was   passed under direct vision. Throughout the                            procedure, the patient's blood pressure, pulse, and                            oxygen saturations were monitored continuously.The                            upper GI endoscopy was accomplished without                            difficulty. The patient tolerated the procedure                            well. The EG-[id number] ([id number]) scope was introduced                            through the and advanced to the second part of                            duodenum. Scope In: 2:01:14 PM Scope Out: 2:10:31 PM Total Procedure Duration: 0 hours 9 minutes 17 seconds  Findings:      Multiple columns of grade 3 sophageal varices involving most of the       tubular esophagus. he lobulated.      They were 6 mm in largest diameter.      Portal hypertensive gastropathy was found in the entire examined       stomach. This was biopsied with a cold forceps for histology. Estimated       blood loss was minimal.      An acquired benign-appearing, intrinsic stenosis was found in the       duodenal bulb. Post bulbar stricture - noncritical. I easily advanced       the scope across this narrowing. Otherwise, the duodenal mucosa appeared       normal. The gastric mucosa was biopsied for histologic study. Seven       bands were successfully placed with incomplete eradication of varices.       There was no bleeding during the procedure. Impression:               - Mucosal changes in the esophagus.                           -  Grade III esophageal varices. Incompletely                            eradicated. Banded.                           - Portal hypertensive gastropathy. Biopsied.                           - Acquired duodenal stenosis - oncritical. Moderate Sedation:      Moderate (conscious) sedation was personally administered by an       anesthesia professional. The following parameters were monitored: oxygen       saturation, heart rate,  blood pressure, respiratory rate, EKG, adequacy       of pulmonary ventilation, and response to care. Total physician       intraservice time was 13 minutes. Recommendation:           - Written discharge instructions were provided to                            the patient.                           - The signs and symptoms of potential delayed                            complications were discussed with the patient.                           - Patient has a contact number available for                            emergencies.                           - Return to normal activities tomorrow.                           - Full liquid diet today.                           - Continue present medications.                           - Repeat upper endoscopy in 1 month for endoscopic                            band ligation.                           - Return to GI clinic in 4 weeks.                           - Patient has a contact number available for                            emergencies. The signs and  symptoms of potential                            delayed complications were discussed with the                            patient. Return to normal activities tomorrow.                            Written discharge instructions were provided to the                            patient.                           - Follow-through on chest CT to further evaluate                            abnormal nodule previously seen.. Procedure Code(s):        --- Professional ---                           [phone], Esophagogastroduodenoscopy, flexible,                            transoral; with band ligation of esophageal/gastric                            varices                           43239, Esophagogastroduodenoscopy, flexible,                            transoral; with biopsy, single or multiple Diagnosis Code(s):        --- Professional ---                           K22.8, Other specified diseases of  esophagus                           I85.00, Esophageal varices without bleeding                           K76.6, Portal hypertension                           K31.89, Other diseases of stomach and duodenum                           K31.5, Obstruction of duodenum                           Z13.810, Encounter for screening for upper                            gastrointestinal disorder  D50.9, Iron deficiency anemia, unspecified CPT copyright 2016 American Medical Association. All rights reserved. The codes documented in this report are preliminary and upon coder review may  be revised to meet current compliance requirements. [name]. Kambre Messner, MD Norvel Richards, MD 02/13/2017 2:20:50 PM This report has been signed electronically. Number of Addenda: 0

## 2017-02-13 NOTE — Telephone Encounter (Signed)
-   Follow-through on chest CT to further evaluate abnormal nodule previously seen.Marland Kitchen PER RMR procedure note on 02/13/2017

## 2017-02-14 ENCOUNTER — Ambulatory Visit (HOSPITAL_COMMUNITY): Payer: Medicare Other | Admitting: Physical Therapy

## 2017-02-14 DIAGNOSIS — R609 Edema, unspecified: Secondary | ICD-10-CM

## 2017-02-14 DIAGNOSIS — L97316 Non-pressure chronic ulcer of right ankle with bone involvement without evidence of necrosis: Secondary | ICD-10-CM | POA: Diagnosis not present

## 2017-02-14 DIAGNOSIS — I872 Venous insufficiency (chronic) (peripheral): Secondary | ICD-10-CM

## 2017-02-14 NOTE — Telephone Encounter (Signed)
Pt is set up for CT scan on 03/01/17@ 1:00 pm. Husband is aware

## 2017-02-14 NOTE — Telephone Encounter (Signed)
No PA is needed
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
## 2017-02-14 NOTE — Telephone Encounter (Signed)
Reviewed records from day of procedure. H/H was not rechecked. Patient needs repeat EGD with banding in 4 weeks per RMR note.  In order to make sure her next banding is in four weeks, can we please go ahead and get her on the books for EGD with esophageal variceal banding with propofol in approximately four weeks. MAKE SURE IT IS AFTER HER SCHEDULED OV WITH ME IN June.   Need repeat CBC in 2 weeks.

## 2017-02-16 ENCOUNTER — Encounter: Payer: Self-pay | Admitting: Internal Medicine

## 2017-02-17 LAB — BPAM RBC
BLOOD PRODUCT EXPIRATION DATE: 201806052359
Blood Product Expiration Date: 201806202359
UNIT TYPE AND RH: 5100
Unit Type and Rh: 5100

## 2017-02-17 LAB — TYPE AND SCREEN
ABO/RH(D): O POS
Antibody Screen: POSITIVE
DAT, IgG: NEGATIVE
UNIT DIVISION: 0
UNIT DIVISION: 0

## 2017-02-19 ENCOUNTER — Other Ambulatory Visit: Payer: Self-pay

## 2017-02-19 DIAGNOSIS — D509 Iron deficiency anemia, unspecified: Secondary | ICD-10-CM

## 2017-02-19 NOTE — Telephone Encounter (Signed)
Holding spot for EGD with esophageal variceal banding with Propofol with RMR for 03/25/17 at 7:30am. Will enter orders for procedure when she comes for OV with [facility] 03/18/17. Called and informed pt's husband. Also informed him that I was mailing order for CBC and pt will need to have blood work done in 2 weeks.

## 2017-02-19 NOTE — Telephone Encounter (Signed)
Thanks

## 2017-02-19 NOTE — Telephone Encounter (Signed)
Will also give pt instructions for procedure at [facility].

## 2017-02-20 ENCOUNTER — Ambulatory Visit (HOSPITAL_COMMUNITY): Payer: Medicare Other | Admitting: Physical Therapy

## 2017-02-20 DIAGNOSIS — I872 Venous insufficiency (chronic) (peripheral): Secondary | ICD-10-CM | POA: Diagnosis not present

## 2017-02-20 DIAGNOSIS — R609 Edema, unspecified: Secondary | ICD-10-CM

## 2017-02-20 DIAGNOSIS — L97316 Non-pressure chronic ulcer of right ankle with bone involvement without evidence of necrosis: Secondary | ICD-10-CM
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
## 2017-02-21 ENCOUNTER — Encounter (HOSPITAL_COMMUNITY): Payer: Self-pay | Admitting: Internal Medicine

## 2017-02-22 ENCOUNTER — Ambulatory Visit (HOSPITAL_COMMUNITY): Payer: Medicare Other | Attending: Internal Medicine | Admitting: Physical Therapy

## 2017-02-22 DIAGNOSIS — R609 Edema, unspecified: Secondary | ICD-10-CM | POA: Insufficient documentation

## 2017-02-22 DIAGNOSIS — I872 Venous insufficiency (chronic) (peripheral): Secondary | ICD-10-CM

## 2017-02-22 DIAGNOSIS — L97316 Non-pressure chronic ulcer of right ankle with bone involvement without evidence of necrosis: Secondary | ICD-10-CM | POA: Diagnosis not present
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
## 2017-02-25 ENCOUNTER — Ambulatory Visit (HOSPITAL_COMMUNITY): Payer: Medicare Other | Admitting: Physical Therapy

## 2017-02-25 DIAGNOSIS — I872 Venous insufficiency (chronic) (peripheral): Secondary | ICD-10-CM

## 2017-02-25 DIAGNOSIS — R609 Edema, unspecified: Secondary | ICD-10-CM | POA: Diagnosis not present

## 2017-02-25 DIAGNOSIS — L97316 Non-pressure chronic ulcer of right ankle with bone involvement without evidence of necrosis: Secondary | ICD-10-CM
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
## 2017-02-26 ENCOUNTER — Other Ambulatory Visit: Payer: Self-pay | Admitting: Gastroenterology

## 2017-02-26 DIAGNOSIS — D509 Iron deficiency anemia, unspecified: Secondary | ICD-10-CM | POA: Diagnosis not present

## 2017-02-27 ENCOUNTER — Telehealth: Payer: Self-pay

## 2017-02-27 LAB — CBC/DIFF AMBIGUOUS DEFAULT
BASOS: 3 %
Basophils Absolute: 0.1 10*3/uL (ref 0.0–0.2)
EOS (ABSOLUTE): 0.2 10*3/uL (ref 0.0–0.4)
Eos: 5 %
Hematocrit: 27.4 % — ABNORMAL LOW (ref 34.0–46.6)
Hemoglobin: 8.2 g/dL — ABNORMAL LOW (ref 11.1–15.9)
IMMATURE GRANS (ABS): 0 10*3/uL (ref 0.0–0.1)
IMMATURE GRANULOCYTES: 0 %
LYMPHS: 22 %
Lymphocytes Absolute: 0.7 10*3/uL (ref 0.7–3.1)
MCH: 24.8 pg — ABNORMAL LOW (ref 26.6–33.0)
MCHC: 29.9 g/dL — ABNORMAL LOW (ref 31.5–35.7)
MCV: 83 fL (ref 79–97)
Monocytes Absolute: 0.5 10*3/uL (ref 0.1–0.9)
Monocytes: 15 %
NEUTROS PCT: 55 %
Neutrophils Absolute: 1.8 10*3/uL (ref 1.4–7.0)
Platelets: 103 10*3/uL — ABNORMAL LOW (ref 150–379)
RBC: 3.3 x10E6/uL — AB (ref 3.77–5.28)
RDW: 16.8 % — AB (ref 12.3–15.4)
WBC: 3.2 10*3/uL — AB (ref 3.4–10.8)

## 2017-02-27 NOTE — Progress Notes (Signed)
It appears the u/s abd is not scheduled. Can we look into this?

## 2017-02-27 NOTE — Telephone Encounter (Signed)
I just got [facility] labs and pt's hemoglobin is now 8.2. It was 8.6 on 02/08/2017. I have notified Neil Crouch, PA and she said this is OK to wait til tomorrow morning to be addressed.

## 2017-02-28 ENCOUNTER — Other Ambulatory Visit: Payer: Self-pay

## 2017-02-28 ENCOUNTER — Ambulatory Visit (HOSPITAL_COMMUNITY): Payer: Medicare Other | Admitting: Physical Therapy

## 2017-02-28 ENCOUNTER — Other Ambulatory Visit: Payer: Self-pay | Admitting: Gastroenterology

## 2017-02-28 DIAGNOSIS — D509 Iron deficiency anemia, unspecified: Secondary | ICD-10-CM

## 2017-02-28 NOTE — Telephone Encounter (Signed)
Lab orders done and mailed to the pt.  

## 2017-02-28 NOTE — Telephone Encounter (Signed)
Spoke with the pt, she is not having any SOB, no dizziness, no lightheadedness, no melena. She said she is feeling ok now. She is aware of appt tomorrow for CT.

## 2017-02-28 NOTE — Telephone Encounter (Signed)
Please find out if patient is having melena, brbpr, shortness of breath, lightheadedness.

## 2017-02-28 NOTE — Telephone Encounter (Signed)
Called [facility] and canceled ultrasound.

## 2017-02-28 NOTE — Telephone Encounter (Signed)
Ginger, please cancel the U/S. Thank you!

## 2017-02-28 NOTE — Telephone Encounter (Signed)
Ultrasound abd done at [facility] 12/2016. We still need chest ct but NO abd u/s.

## 2017-02-28 NOTE — Telephone Encounter (Signed)
I spoke with Ginger, prior U/S was ordered but not done at [facility]. Ginger called [facility] and they are going to do the U/S while pt is there for the CT tomorrow.

## 2017-02-28 NOTE — Telephone Encounter (Signed)
Can we please have CBC, iron/tibc, ferritin done in 2 weeks for anemia. thanks

## 2017-02-28 NOTE — Progress Notes (Signed)
Pt is going to have [place] tomorrow also with the CT. Husband is aware to be there at 21:15 pm and nothing to eat or drink after midnight.

## 2017-02-28 NOTE — Progress Notes (Signed)
Hgb 9.4 after transfusion at [facility] back in 10/2016.  Platelets stable.  See telephone encounter. She will have repeat labs in 2 weeks to make sure stable. CT chest tomorrow as planned.

## 2017-03-01 ENCOUNTER — Ambulatory Visit (HOSPITAL_COMMUNITY)
Admission: RE | Admit: 2017-03-01 | Discharge: 2017-03-01 | Disposition: A | Discharge status: Home / Self Care | Payer: Medicare Other | Source: Ambulatory Visit | Attending: Gastroenterology | Admitting: Gastroenterology

## 2017-03-01 ENCOUNTER — Ambulatory Visit (HOSPITAL_COMMUNITY): Payer: Medicare Other

## 2017-03-01 DIAGNOSIS — R911 Solitary pulmonary nodule: Secondary | ICD-10-CM | POA: Insufficient documentation

## 2017-03-01 MED ORDER — IOPAMIDOL (ISOVUE-300) INJECTION 61%
75.0000 mL | Freq: Once | INTRAVENOUS | Status: AC | PRN
  Administered 2017-03-01: 60 mL via INTRAVENOUS

## 2017-03-06 NOTE — Progress Notes (Signed)
Please let patient know her pulmonary nodule has reduced in size significantly and per radiologist, no further work up is needed.  Keep ov as scheduled and updated labs as planned.

## 2017-03-14 ENCOUNTER — Other Ambulatory Visit: Payer: Self-pay | Admitting: Gastroenterology

## 2017-03-14 DIAGNOSIS — D509 Iron deficiency anemia, unspecified: Secondary | ICD-10-CM | POA: Diagnosis not present

## 2017-03-15 LAB — CBC/DIFF AMBIGUOUS DEFAULT
Basophils Absolute: 0.1 10*3/uL (ref 0.0–0.2)
Basos: 2 %
EOS (ABSOLUTE): 0.2 10*3/uL (ref 0.0–0.4)
EOS: 5 %
Hematocrit: 29.8 % — ABNORMAL LOW (ref 34.0–46.6)
Hemoglobin: 9 g/dL — ABNORMAL LOW (ref 11.1–15.9)
Immature Grans (Abs): 0 10*3/uL (ref 0.0–0.1)
Immature Granulocytes: 0 %
Lymphocytes Absolute: 0.8 10*3/uL (ref 0.7–3.1)
Lymphs: 23 %
MCH: 25.1 pg — ABNORMAL LOW (ref 26.6–33.0)
MCHC: 30.2 g/dL — AB (ref 31.5–35.7)
MCV: 83 fL (ref 79–97)
MONOS ABS: 0.4 10*3/uL (ref 0.1–0.9)
Monocytes: 11 %
Neutrophils Absolute: 2.1 10*3/uL (ref 1.4–7.0)
Neutrophils: 59 %
Platelets: 116 10*3/uL — ABNORMAL LOW (ref 150–379)
RBC: 3.58 x10E6/uL — AB (ref 3.77–5.28)
RDW: 17 % — AB (ref 12.3–15.4)
WBC: 3.6 10*3/uL (ref 3.4–10.8)

## 2017-03-15 LAB — IRON AND TIBC
Iron Saturation: 11 % — ABNORMAL LOW (ref 15–55)
Iron: 37 ug/dL (ref 27–139)
Total Iron Binding Capacity: 350 ug/dL (ref 250–450)
UIBC: 313 ug/dL (ref 118–369)

## 2017-03-15 LAB — FERRITIN: Ferritin: 11 ng/mL — ABNORMAL LOW (ref 15–150)

## 2017-03-18 ENCOUNTER — Encounter: Payer: Self-pay | Admitting: Gastroenterology

## 2017-03-18 ENCOUNTER — Ambulatory Visit (INDEPENDENT_AMBULATORY_CARE_PROVIDER_SITE_OTHER): Payer: Medicare Other | Admitting: Gastroenterology

## 2017-03-18 ENCOUNTER — Telehealth: Payer: Self-pay

## 2017-03-18 VITALS — BP 104/51 | HR 59 | Temp 97.0°F | Ht 64.0 in | Wt 157.6 lb

## 2017-03-18 DIAGNOSIS — I8500 Esophageal varices without bleeding: Secondary | ICD-10-CM

## 2017-03-18 DIAGNOSIS — K7460 Unspecified cirrhosis of liver: Secondary | ICD-10-CM | POA: Diagnosis not present

## 2017-03-18 DIAGNOSIS — E611 Iron deficiency: Secondary | ICD-10-CM | POA: Diagnosis not present

## 2017-03-18 DIAGNOSIS — R634 Abnormal weight loss: Secondary | ICD-10-CM | POA: Diagnosis not present

## 2017-03-18 NOTE — Telephone Encounter (Signed)
Labs are back from [facility] and are in LSL box 

## 2017-03-18 NOTE — Patient Instructions (Signed)
1. Upper endoscopy as scheduled. See separate instructions. 2. Please try to increase your food intake each day. You can use supplemental drinks such as Glucerna or Carnation Instant Breakfast. Make sure you are getting good protein in your diet, from meats, beans.    Protein Content in Foods Generally, most healthy people need around 50 grams of protein each day. Depending on your overall health, you may need more or less protein in your diet. Talk to your health care provider or dietitian about how much protein you need. See the following list for the protein content of some common foods. High-protein foods High-protein foods contain 4 grams (4 g) or more of protein per serving. They include:  Beef, ground sirloin (cooked) - 3 oz have 24 g of protein.  Cheese (hard) - 1 oz has 7 g of protein.  Chicken breast, boneless and skinless (cooked) - 3 oz have 13.4 g of protein.  Cottage cheese - 1/2 cup has 13.4 g of protein.  Egg - 1 egg has 6 g of protein.  Fish, filet (cooked) - 1 oz has 6-7 g of protein.  Garbanzo beans (canned or cooked) - 1/2 cup has 6-7 g of protein.  Kidney beans (canned or cooked) - 1/2 cup has 6-7 g of protein.  Lamb (cooked) - 3 oz has 24 g of protein.  Milk - 1 cup (8 oz) has 8 g of protein.  Nuts (peanuts, pistachios, almonds) - 1 oz has 6 g of protein.  Peanut butter - 1 oz has 7-8 g of protein.  Pork tenderloin (cooked) - 3 oz has 18.4 g of protein.  Pumpkin seeds - 1 oz has 8.5 g of protein.  Soybeans (roasted) - 1 oz has 8 g of protein.  Soybeans (cooked) - 1/2 cup has 11 g of protein.  Soy milk - 1 cup (8 oz) has 5-10 g of protein.  Soy or vegetable patty - 1 patty has 11 g of protein.  Sunflower seeds - 1 oz has 5.5 g of protein.  Tofu (firm) - 1/2 cup has 20 g of protein.  Tuna (canned in water) - 3 oz has 20 g of protein.  Yogurt - 6 oz has 8 g of protein.  Low-protein foods Low-protein foods contain 3 grams (3 g) or less of  protein per serving. They include:  Beets (raw or cooked) - 1/2 cup has 1.5 g of protein.  Bran cereal - 1/2 cup has 2-3 g of protein.  Bread - 1 slice has 2.5 g of protein.  Broccoli (raw or cooked) - 1/2 cup has 2 g of protein.  Collard greens (raw or cooked) - 1/2 cup has 2 g of protein.  Corn (fresh or cooked) - 1/2 cup has 2 g of protein.  Cream cheese - 1 oz has 2 g of protein.  Creamer (half-and-half) - 1 oz has 1 g of protein.  Flour tortilla - 1 tortilla has 2.5 g of protein  Frozen yogurt - 1/2 cup has 3 g of protein.  Fruit or vegetable juice - 1/2 cup has 1 g of protein.  Green beans (raw or cooked) - 1/2 cup has 1 g of protein.  Green peas (canned) - 1/2 cup has 3.5 g of protein.  Muffins - 1 small muffin (2 oz) has 3 g of protein.  Oatmeal (cooked) - 1/2 cup has 3 g of protein.  Potato (baked with skin) - 1 medium potato has 3 g of protein.  Rice (cooked) -  1/2 cup has 2.5-3.5 g of protein.  Sour cream - 1/2 cup has 2.5 g of protein.  Spinach (cooked) - 1/2 cup has 3 g of protein.  Squash (cooked) - 1/2 cup has 1.5 g of protein.  Actual amounts of protein may be different depending on processing. Talk with your health care provider or dietitian about what foods are recommended for you. This information is not intended to replace advice given to you by your health care provider. Make sure you discuss any questions you have with your health care provider. Document Released: 12/10/2015 Document Revised: 05/21/2016 Document Reviewed: 05/21/2016 Elsevier Interactive Patient Education  [employer].

## 2017-03-18 NOTE — Telephone Encounter (Signed)
See result note.  

## 2017-03-18 NOTE — Progress Notes (Signed)
Primary Care Physician:  Neale Burly, MD  Primary Gastroenterologist:  Garfield Cornea, MD   Chief Complaint  Patient presents with  . Cirrhosis    HPI:  Melissa Rose is a 76 y.o. female here To schedule EGD for esophageal variceal banding. Patient underwent EGD on May 23 and had multiple columns of grade 3 esophageal varices status post multiple bands placed but incompletely eradicated. 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 Advise come back for EGD with banding in 4 weeks.  We initially met patient back in April for management of her cirrhosis and esophageal varices. She had presented to the hospital back in February with IDA requiring blood transfusions.Patient had outpatient workup for anemia with Dr. Ladona Horns. EGD/TCS done. Colonoscopy reported to be normal. EGD showed esophageal varices (no size/number reported), non bleeding. 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   When I saw her she was unaware of any history of cirrhosis. Noted second 2015 CT abdomen pelvis. She also had a chest CT with suspicious 11 mm left upper lobe pulmonary nodule which we arrange for follow-up chest CT which showed significant reduction in size of the nodule and no further follow-up required. She also had an abdominal ultrasound in April her by her PCP showing cirrhosis but no hepatoma.  Her current labs show stable hemoglobin of 9, hematocrit 29.8, MCV 83, platelets 116,000, white blood cell count 3.6. Her ferritin is low at 11, TIBC normal 350, iron saturation is low at 11%.  Continues to lose significant weight. She is down nearly 25 pounds since April 12.  8 pounds since last month. Weight 207 pounds back in 2015 down 157 today.  Decline in appetite. Eating spoonful of stuff. Husband states she's never been a big eater.. No pp abdominal pain. No n/v. No adjustment in her diabetes medications. Bowel movements  regular. No blood in the stool or melena.    Current Outpatient Prescriptions  Medication Sig Dispense Refill  . atorvastatin (LIPITOR) 10 MG tablet Take 10 mg by mouth at bedtime.     . Calcium Carb-Cholecalciferol (CALCIUM 600/VITAMIN D3) 600-800 MG-UNIT TABS Take 1 tablet by mouth daily.    Marland Kitchen donepezil (ARICEPT) 10 MG tablet Take 10 mg by mouth every evening.     . furosemide (LASIX) 20 MG tablet Take 20 mg by mouth daily.    Marland Kitchen glipiZIDE (GLUCOTROL) 5 MG tablet Take 5 mg by mouth 2 (two) times daily.    Marland Kitchen lactulose (CHRONULAC) 10 GM/15ML solution Take 10 g by mouth 2 (two) times daily.    Marland Kitchen levothyroxine (SYNTHROID, LEVOTHROID) 100 MCG tablet Take 100 mcg by mouth daily.     Marland Kitchen lisinopril (PRINIVIL,ZESTRIL) 10 MG tablet Take 10 mg by mouth daily.    . metFORMIN (GLUCOPHAGE) 1000 MG tablet Take 1,000 mg by mouth 2 (two) times daily.    . propranolol (INDERAL) 10 MG tablet Take 10 mg by mouth 2 (two) times daily.    . ranitidine (ZANTAC) 300 MG tablet Take 300 mg by mouth 2 (two) times daily.     Marland Kitchen spironolactone (ALDACTONE) 25 MG tablet Take 12.5 mg by mouth 2 (two) times daily.     No current facility-administered medications for this visit.     Allergies as of 03/18/2017  . (No Known Allergies)    Past Medical History:  Diagnosis Date  . Breast cancer ([place]) 2000   treated with chemo/radiation  . Diabetes mellitus without complication ([place])   .  Hyperlipidemia   . Hypertension   . Hypothyroidism     Past Surgical History:  Procedure Laterality Date  . BREAST SURGERY Right   . 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 COLONOSCOPY  11/2016   Dr. Ladona Horns: normal  . ESOPHAGEAL BANDING N/A 02/13/2017   Procedure: ESOPHAGEAL BANDING;  Surgeon: Daneil Dolin, MD;  Location: [facility];  Service: Endoscopy;  Laterality: N/A;  . ESOPHAGOGASTRODUODENOSCOPY  11/2016   Dr. Ladona Horns: Short stricture in the first part of  the duodenum, not traversable., There were varices in the distal esophagus which were not bleeding.  . ESOPHAGOGASTRODUODENOSCOPY (EGD) WITH PROPOFOL N/A 02/13/2017   Procedure: ESOPHAGOGASTRODUODENOSCOPY (EGD) WITH PROPOFOL;  Surgeon: Daneil Dolin, MD;  Location: [facility];  Service: Endoscopy;  Laterality: N/A;  145     Family History  Problem Relation Age of Onset  . Diabetes Mother   . Diabetes Father   . Heart disease Father   . Cerebral palsy Sister   . Liver disease Neg Hx   . Colon cancer Neg Hx     Social History   Social History  . Marital status: Married    Spouse name: N/A  . Number of children: N/A  . Years of education: N/A   Occupational History  . Not on file.   Social History Main Topics  . Smoking status: Never Smoker  . Smokeless tobacco: Never Used  . Alcohol use No  . Drug use: No  . Sexual activity: Not on file   Other Topics Concern  . Not on file   Social History Narrative  . No narrative on file      ROS:  General: Negative for   fever, chills, fatigue, weakness.See history of present illness Eyes: Negative for vision changes.  ENT: Negative for hoarseness, difficulty swallowing , nasal congestion. CV: Negative for chest pain, angina, palpitations, dyspnea on exertion, peripheral edema.  Respiratory: Negative for dyspnea at rest, dyspnea on exertion, cough, sputum, wheezing.  GI: See history of present illness. GU:  Negative for dysuria, hematuria, urinary incontinence, urinary frequency, nocturnal urination.  MS: Negative for joint pain, low back pain.  Derm: Negative for rash or itching.  Neuro: Negative for weakness, abnormal sensation, seizure, frequent headaches, memory loss, confusion.  Psych: Negative for anxiety, depression, suicidal ideation, hallucinations.  Endo: See history of present illness Heme: Negative for bruising or bleeding. Allergy: Negative for rash or hives.    Physical Examination:  BP (!) 104/51    Pulse (!) 59   Temp 97 F (36.1 C) (Oral)   Ht 5' 4"  (1.626 m)   Wt 157 lb 9.6 oz (71.5 kg)   BMI 27.05 kg/m    General: Well-nourished, well-developed in no acute distress. Accompanied by spouse. Both are very difficult historians. Head: Normocephalic, atraumatic.   Eyes: Conjunctiva pink, no icterus. Mouth: Oropharyngeal mucosa moist and pink , no lesions erythema or exudate. Neck: Supple without thyromegaly, masses, or lymphadenopathy.  Lungs: Clear to auscultation bilaterally.  Heart: Regular rate and rhythm, no murmurs rubs or gallops.  Abdomen: Bowel sounds are normal, nontender, nondistended, no hepatosplenomegaly or masses, no abdominal bruits or    hernia , no rebound or guarding.   Rectal: Not performed Extremities: No lower extremity edema. No clubbing or deformities.  Neuro: Alert and oriented x 4 , grossly normal neurologically.  Skin: Warm and dry, no rash or jaundice.   Psych:  Alert and cooperative, normal mood and affect.  Labs: Lab Results  Component Value Date   CREATININE 1.23 (H) 02/08/2017   BUN 13 02/08/2017   NA 134 (L) 02/08/2017   K 3.8 02/08/2017   CL 107 02/08/2017   CO2 23 02/08/2017   Labs from February 2018, total bilirubin 0.6, alkaline phosphatase 121, AST 27, ALT 21, albumin 2.1, INR 1.2.  Lab Results  Component Value Date   WBC 3.6 03/14/2017   HGB 9.0 (L) 03/14/2017   HCT 29.8 (L) 03/14/2017   MCV 83 03/14/2017   PLT 116 (L) 03/14/2017   Lab Results  Component Value Date   IRON 37 03/14/2017   TIBC 350 03/14/2017   FERRITIN 11 (L) 03/14/2017     Imaging Studies: Ct Chest W Contrast  Result Date: 03/01/2017 CLINICAL DATA:  Follow-up pulmonary nodule EXAM: CT CHEST WITH CONTRAST TECHNIQUE: Multidetector CT imaging of the chest was performed during intravenous contrast administration. CONTRAST:  44m ISOVUE-300 IOPAMIDOL (ISOVUE-300) INJECTION 61% COMPARISON:  07/06/2014 FINDINGS: Cardiovascular: Thoracic aorta and its branches  demonstrate atherosclerotic calcifications. No aneurysmal dilatation is noted. No cardiac enlargement is seen. Mediastinum/Nodes: Thoracic inlet is within normal limits. No hilar or mediastinal adenopathy is noted. The esophagus as visualized is within normal limits with the exception of multiple esophageal varices distally. Lungs/Pleura: The lungs are well aerated bilaterally. The previously seen left upper lobe nodule has regressed significantly in the interval from the prior exam with only a small residual scar remaining. No new focal nodules are seen. No focal infiltrate is noted. Mild dependent atelectatic changes are noted in the bases. No sizable effusion is seen. Upper Abdomen: Cirrhotic changes of the liver are noted with enlargement of the portal vein. Multiple gastric and esophageal varices are seen. Musculoskeletal: Degenerative changes of the thoracic spine are noted. 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 Significant reduction in size in previously seen left upper lobe nodule. No further follow-up is required. No new nodule is seen. Electronically Signed   By: MInez CatalinaM.D.   On: 03/01/2017 14:04

## 2017-03-19 ENCOUNTER — Telehealth: Payer: Self-pay

## 2017-03-19 ENCOUNTER — Other Ambulatory Visit: Payer: Self-pay

## 2017-03-19 DIAGNOSIS — R634 Abnormal weight loss: Secondary | ICD-10-CM | POA: Insufficient documentation

## 2017-03-19 DIAGNOSIS — K7460 Unspecified cirrhosis of liver: Secondary | ICD-10-CM

## 2017-03-19 DIAGNOSIS — D649 Anemia, unspecified: Secondary | ICD-10-CM

## 2017-03-19 DIAGNOSIS — I8500 Esophageal varices without bleeding: Secondary | ICD-10-CM

## 2017-03-19 NOTE — Telephone Encounter (Signed)
PA info for EGD/esophagel varices banding submitted via [facility] website. No PA needed. Decision ID# [id number].

## 2017-03-19 NOTE — Telephone Encounter (Signed)
Called and spoke to pt's husband. We were holding spot on schedule for pt to have EGD/esophageal varices banding w/Propofol with RMR 03/25/17 at 7:30am. Instructions given on phone to husband.   Called husband back and informed of pre-op appt 03/21/17 at 1:15pm. Also asked him to stop by our office when he takes her to pre-op appt to pick-up instructions.

## 2017-03-19 NOTE — Assessment & Plan Note (Addendum)
76 year old lady with multiple issues including cirrhosis with portal hypertension, esophageal varices due for repeat banding at this time. Plan on EGD with esophageal variceal banding in the near future in the OR.  I have discussed the risks, alternatives, benefits with regards to but not limited to the risk of reaction to medication, bleeding, infection, perforation and the patient is agreeable to proceed. Written consent to be obtained.  She also has decline in appetite, progressive weight loss of 25 pounds since April however over 50 pounds in the past 3 years. Unclear how much of this is related to cognitive dysfunction, patient is Aricept. She did have duodenal bulb stricture which appeared to be noncritical at time of last EGD. Would consider CT abdomen pelvis with contrast after her upper endoscopy for further evaluation of abnormal weight loss.  Encouraged increase oral intake including more protein.   She has stable anemia, likely mixed with some iron deficiency. Colonoscopy and EGD as outlined previously. We'll continue to monitor.  Suspect cirrhosis possibly related to [facility] however consider limited serologies with next lab draw.

## 2017-03-19 NOTE — Progress Notes (Signed)
Can we please plan on follow labs in four weeks.   CBC, ferritin, CMET, PT/INR, AFP tumor marker, Hep B surface antigen, Hep B surface antibody, Hep B core total ab, HCV Ab, Hep A total Ab.

## 2017-03-20 ENCOUNTER — Other Ambulatory Visit: Payer: Self-pay | Admitting: Gastroenterology

## 2017-03-20 ENCOUNTER — Other Ambulatory Visit: Payer: Self-pay

## 2017-03-20 DIAGNOSIS — K7460 Unspecified cirrhosis of liver: Secondary | ICD-10-CM

## 2017-03-20 NOTE — Progress Notes (Signed)
CC'D TO PCP °

## 2017-03-20 NOTE — Progress Notes (Signed)
Lab orders done and mailed to the pt.  

## 2017-03-21 ENCOUNTER — Encounter (HOSPITAL_COMMUNITY)
Admission: RE | Admit: 2017-03-21 | Discharge: 2017-03-21 | Disposition: A | Discharge status: Home / Self Care | Payer: Medicare Other | Source: Ambulatory Visit | Attending: Internal Medicine | Admitting: Internal Medicine

## 2017-03-21 ENCOUNTER — Encounter (HOSPITAL_COMMUNITY): Payer: Self-pay

## 2017-03-21 DIAGNOSIS — I8500 Esophageal varices without bleeding: Secondary | ICD-10-CM | POA: Insufficient documentation

## 2017-03-21 DIAGNOSIS — K7460 Unspecified cirrhosis of liver: Secondary | ICD-10-CM | POA: Insufficient documentation

## 2017-03-21 DIAGNOSIS — Z01818 Encounter for other preprocedural examination: Secondary | ICD-10-CM | POA: Insufficient documentation

## 2017-03-21 HISTORY — DX: Gastro-esophageal reflux disease without esophagitis: K21.9

## 2017-03-21 HISTORY — DX: Unspecified dementia, unspecified severity, without behavioral disturbance, psychotic disturbance, mood disturbance, and anxiety: F03.90

## 2017-03-21 HISTORY — DX: Unspecified osteoarthritis, unspecified site: M19.90

## 2017-03-21 LAB — SURGICAL PCR SCREEN
MRSA, PCR: POSITIVE — AB
STAPHYLOCOCCUS AUREUS: POSITIVE — AB

## 2017-03-21 NOTE — Patient Instructions (Signed)
Melissa Rose  03/21/2017     @PREFPERIOPPHARMACY @   Your procedure is scheduled on  03/25/2017 .  Report to [facility] at  615  A.M.  Call this number if you have problems the morning of surgery:  [phone]   Remember:  Do not eat food or drink liquids after midnight.  Take these medicines the morning of surgery with A SIP OF WATER  Aricept, levothyroxine, propranolol, zantac.   Do not wear jewelry, make-up or nail polish.  Do not wear lotions, powders, or perfumes, or deoderant.  Do not shave 48 hours prior to surgery.  Men may shave face and neck.  Do not bring valuables to the hospital.  [facility] is not responsible for any belongings or valuables.  Contacts, dentures or bridgework may not be worn into surgery.  Leave your suitcase in the car.  After surgery it may be brought to your room.  For patients admitted to the hospital, discharge time will be determined by your treatment team.  Patients discharged the day of surgery will not be allowed to drive home.   Name and phone number of your driver:  family Special instructions:  Follow the diet instructions given to you by Dr Nona Dell office.  Please read over the following fact sheets that you were given. Anesthesia Post-op Instructions and Care and Recovery After Surgery       Esophagogastroduodenoscopy Esophagogastroduodenoscopy (EGD) is a procedure to examine the lining of the esophagus, stomach, and first part of the small intestine (duodenum). This procedure is done to check for problems such as inflammation, bleeding, ulcers, or growths. During this procedure, a long, flexible, lighted tube with a camera attached (endoscope) is inserted down the throat. Tell a health care provider about:  Any allergies you have.  All medicines you are taking, including vitamins, herbs, eye drops, creams, and over-the-counter medicines.  Any problems you or family members have had with anesthetic  medicines.  Any blood disorders you have.  Any surgeries you have had.  Any medical conditions you have.  Whether you are pregnant or may be pregnant. What are the risks? Generally, this is a safe procedure. However, problems may occur, including:  Infection.  Bleeding.  A tear (perforation) in the esophagus, stomach, or duodenum.  Trouble breathing.  Excessive sweating.  Spasms of the larynx.  A slowed heartbeat.  Low blood pressure.  What happens before the procedure?  Follow instructions from your health care provider about eating or drinking restrictions.  Ask your health care provider about: ? Changing or stopping your regular medicines. This is especially important if you are taking diabetes medicines or blood thinners. ? Taking medicines such as aspirin and ibuprofen. These medicines can thin your blood. Do not take these medicines before your procedure if your health care provider instructs you not to.  Plan to have someone take you home after the procedure.  If you wear dentures, be ready to remove them before the procedure. What happens during the procedure?  To reduce your risk of infection, your health care team will wash or sanitize their hands.  An IV tube will be put in a vein in your hand or arm. You will get medicines and fluids through this tube.  You will be given one or more of the following: ? A medicine to help you relax (sedative). ? A medicine to numb the area (local anesthetic). This medicine may be sprayed into your  throat. It will make you feel more comfortable and keep you from gagging or coughing during the procedure. ? A medicine for pain.  A mouth guard may be placed in your mouth to protect your teeth and to keep you from biting on the endoscope.  You will be asked to lie on your left side.  The endoscope will be lowered down your throat into your esophagus, stomach, and duodenum.  Air will be put into the endoscope. This will  help your health care provider see better.  The lining of your esophagus, stomach, and duodenum will be examined.  Your health care provider may: ? Take a tissue sample so it can be looked at in a lab (biopsy). ? Remove growths. ? Remove objects (foreign bodies) that are stuck. ? Treat any bleeding with medicines or other devices that stop tissue from bleeding. ? Widen (dilate) or stretch narrowed areas of your esophagus and stomach.  The endoscope will be taken out. The procedure may vary among health care providers and hospitals. What happens after the procedure?  Your blood pressure, heart rate, breathing rate, and blood oxygen level will be monitored often until the medicines you were given have worn off.  Do not eat or drink anything until the numbing medicine has worn off and your gag reflex has returned. This information is not intended to replace advice given to you by your health care provider. Make sure you discuss any questions you have with your health care provider. Document Released: 01/11/2005 Document Revised: 02/16/2016 Document Reviewed: 08/04/2015 Elsevier Interactive Patient Education  2018 [employer]. Esophagogastroduodenoscopy, Care After Refer to this sheet in the next few weeks. These instructions provide you with information about caring for yourself after your procedure. Your health care provider may also give you more specific instructions. Your treatment has been planned according to current medical practices, but problems sometimes occur. Call your health care provider if you have any problems or questions after your procedure. What can I expect after the procedure? After the procedure, it is common to have:  A sore throat.  Nausea.  Bloating.  Dizziness.  Fatigue.  Follow these instructions at home:  Do not eat or drink anything until the numbing medicine (local anesthetic) has worn off and your gag reflex has returned. You will know that the  local anesthetic has worn off when you can swallow comfortably.  Do not drive for 24 hours if you received a medicine to help you relax (sedative).  If your health care provider took a tissue sample for testing during the procedure, make sure to get your test results. This is your responsibility. Ask your health care provider or the department performing the test when your results will be ready.  Keep all follow-up visits as told by your health care provider. This is important. Contact a health care provider if:  You cannot stop coughing.  You are not urinating.  You are urinating less than usual. Get help right away if:  You have trouble swallowing.  You cannot eat or drink.  You have throat or chest pain that gets worse.  You are dizzy or light-headed.  You faint.  You have nausea or vomiting.  You have chills.  You have a fever.  You have severe abdominal pain.  You have black, tarry, or bloody stools. This information is not intended to replace advice given to you by your health care provider. Make sure you discuss any questions you have with your health care provider.  Document Released: 08/27/2012 Document Revised: 02/16/2016 Document Reviewed: 08/04/2015 Elsevier Interactive Patient Education  2018 [place] Anesthesia is a term that refers to techniques, procedures, and medicines that help a person stay safe and comfortable during a medical procedure. Monitored anesthesia care, or sedation, is one type of anesthesia. Your anesthesia specialist may recommend sedation if you will be having a procedure that does not require you to be unconscious, such as:  Cataract surgery.  A dental procedure.  A biopsy.  A colonoscopy.  During the procedure, you may receive a medicine to help you relax (sedative). There are three levels of sedation:  Mild sedation. At this level, you may feel awake and relaxed. You will be able to follow  directions.  Moderate sedation. At this level, you will be sleepy. You may not remember the procedure.  Deep sedation. At this level, you will be asleep. You will not remember the procedure.  The more medicine you are given, the deeper your level of sedation will be. Depending on how you respond to the procedure, the anesthesia specialist may change your level of sedation or the type of anesthesia to fit your needs. An anesthesia specialist will monitor you closely during the procedure. Let your health care provider know about:  Any allergies you have.  All medicines you are taking, including vitamins, herbs, eye drops, creams, and over-the-counter medicines.  Any use of steroids (by mouth or as a cream).  Any problems you or family members have had with sedatives and anesthetic medicines.  Any blood disorders you have.  Any surgeries you have had.  Any medical conditions you have, such as sleep apnea.  Whether you are pregnant or may be pregnant.  Any use of cigarettes, alcohol, or street drugs. What are the risks? Generally, this is a safe procedure. However, problems may occur, including:  Getting too much medicine (oversedation).  Nausea.  Allergic reaction to medicines.  Trouble breathing. If this happens, a breathing tube may be used to help with breathing. It will be removed when you are awake and breathing on your own.  Heart trouble.  Lung trouble.  Before the procedure Staying hydrated Follow instructions from your health care provider about hydration, which may include:  Up to 2 hours before the procedure - you may continue to drink clear liquids, such as water, clear fruit juice, black coffee, and plain tea.  Eating and drinking restrictions Follow instructions from your health care provider about eating and drinking, which may include:  8 hours before the procedure - stop eating heavy meals or foods such as meat, fried foods, or fatty foods.  6 hours  before the procedure - stop eating light meals or foods, such as toast or cereal.  6 hours before the procedure - stop drinking milk or drinks that contain milk.  2 hours before the procedure - stop drinking clear liquids.  Medicines Ask your health care provider about:  Changing or stopping your regular medicines. This is especially important if you are taking diabetes medicines or blood thinners.  Taking medicines such as aspirin and ibuprofen. These medicines can thin your blood. Do not take these medicines before your procedure if your health care provider instructs you not to.  Tests and exams  You will have a physical exam.  You may have blood tests done to show: ? How well your kidneys and liver are working. ? How well your blood can clot.  General instructions  Plan to have  someone take you home from the hospital or clinic.  If you will be going home right after the procedure, plan to have someone with you for 24 hours.  What happens during the procedure?  Your blood pressure, heart rate, breathing, level of pain and overall condition will be monitored.  An IV tube will be inserted into one of your veins.  Your anesthesia specialist will give you medicines as needed to keep you comfortable during the procedure. This may mean changing the level of sedation.  The procedure will be performed. After the procedure  Your blood pressure, heart rate, breathing rate, and blood oxygen level will be monitored until the medicines you were given have worn off.  Do not drive for 24 hours if you received a sedative.  You may: ? Feel sleepy, clumsy, or nauseous. ? Feel forgetful about what happened after the procedure. ? Have a sore throat if you had a breathing tube during the procedure. ? Vomit. This information is not intended to replace advice given to you by your health care provider. Make sure you discuss any questions you have with your health care provider. Document  Released: 06/06/2005 Document Revised: 02/17/2016 Document Reviewed: 01/01/2016 Elsevier Interactive Patient Education  2018 [place], Care After These instructions provide you with information about caring for yourself after your procedure. Your health care provider may also give you more specific instructions. Your treatment has been planned according to current medical practices, but problems sometimes occur. Call your health care provider if you have any problems or questions after your procedure. What can I expect after the procedure? After your procedure, it is common to:  Feel sleepy for several hours.  Feel clumsy and have poor balance for several hours.  Feel forgetful about what happened after the procedure.  Have poor judgment for several hours.  Feel nauseous or vomit.  Have a sore throat if you had a breathing tube during the procedure.  Follow these instructions at home: For at least 24 hours after the procedure:   Do not: ? Participate in activities in which you could fall or become injured. ? Drive. ? Use heavy machinery. ? Drink alcohol. ? Take sleeping pills or medicines that cause drowsiness. ? Make important decisions or sign legal documents. ? Take care of children on your own.  Rest. Eating and drinking  Follow the diet that is recommended by your health care provider.  If you vomit, drink water, juice, or soup when you can drink without vomiting.  Make sure you have little or no nausea before eating solid foods. General instructions  Have a responsible adult stay with you until you are awake and alert.  Take over-the-counter and prescription medicines only as told by your health care provider.  If you smoke, do not smoke without supervision.  Keep all follow-up visits as told by your health care provider. This is important. Contact a health care provider if:  You keep feeling nauseous or you keep  vomiting.  You feel light-headed.  You develop a rash.  You have a fever. Get help right away if:  You have trouble breathing. This information is not intended to replace advice given to you by your health care provider. Make sure you discuss any questions you have with your health care provider. Document Released: 01/01/2016 Document Revised: 05/02/2016 Document Reviewed: 01/01/2016 Elsevier Interactive Patient Education  [employer].

## 2017-03-22 NOTE — Progress Notes (Signed)
Labs remain stable. Likely some iron deficiency. egd as planned.

## 2017-03-22 NOTE — Progress Notes (Signed)
She has additional labs already scheduled for approximately four week.

## 2017-03-25 ENCOUNTER — Encounter (HOSPITAL_COMMUNITY): Payer: Self-pay

## 2017-03-25 ENCOUNTER — Ambulatory Visit (HOSPITAL_COMMUNITY): Payer: Medicare Other | Admitting: Anesthesiology

## 2017-03-25 ENCOUNTER — Encounter (HOSPITAL_COMMUNITY)
Admission: RE | Disposition: A | Discharge status: Home / Self Care | Payer: Self-pay | Source: Ambulatory Visit | Attending: Internal Medicine

## 2017-03-25 ENCOUNTER — Ambulatory Visit (HOSPITAL_COMMUNITY)
Admission: RE | Admit: 2017-03-25 | Discharge: 2017-03-25 | Disposition: A | Discharge status: Home / Self Care | Payer: Medicare Other | Source: Ambulatory Visit | Attending: Internal Medicine | Admitting: Internal Medicine

## 2017-03-25 DIAGNOSIS — I10 Essential (primary) hypertension: Secondary | ICD-10-CM | POA: Diagnosis not present

## 2017-03-25 DIAGNOSIS — Z853 Personal history of malignant neoplasm of breast: Secondary | ICD-10-CM | POA: Insufficient documentation

## 2017-03-25 DIAGNOSIS — K3189 Other diseases of stomach and duodenum: Secondary | ICD-10-CM | POA: Diagnosis not present

## 2017-03-25 DIAGNOSIS — I8510 Secondary esophageal varices without bleeding: Secondary | ICD-10-CM | POA: Insufficient documentation

## 2017-03-25 DIAGNOSIS — K766 Portal hypertension: Secondary | ICD-10-CM | POA: Insufficient documentation

## 2017-03-25 DIAGNOSIS — D696 Thrombocytopenia, unspecified: Secondary | ICD-10-CM | POA: Insufficient documentation

## 2017-03-25 DIAGNOSIS — D731 Hypersplenism: Secondary | ICD-10-CM | POA: Diagnosis not present

## 2017-03-25 DIAGNOSIS — K7460 Unspecified cirrhosis of liver: Secondary | ICD-10-CM | POA: Diagnosis not present

## 2017-03-25 DIAGNOSIS — Z79899 Other long term (current) drug therapy: Secondary | ICD-10-CM | POA: Diagnosis not present

## 2017-03-25 DIAGNOSIS — D649 Anemia, unspecified: Secondary | ICD-10-CM

## 2017-03-25 DIAGNOSIS — K315 Obstruction of duodenum: Secondary | ICD-10-CM | POA: Diagnosis not present

## 2017-03-25 DIAGNOSIS — E039 Hypothyroidism, unspecified: Secondary | ICD-10-CM | POA: Diagnosis not present

## 2017-03-25 DIAGNOSIS — I8500 Esophageal varices without bleeding: Secondary | ICD-10-CM | POA: Diagnosis not present

## 2017-03-25 DIAGNOSIS — Z9221 Personal history of antineoplastic chemotherapy: Secondary | ICD-10-CM | POA: Insufficient documentation

## 2017-03-25 DIAGNOSIS — Z13810 Encounter for screening for upper gastrointestinal disorder: Secondary | ICD-10-CM

## 2017-03-25 DIAGNOSIS — E785 Hyperlipidemia, unspecified: Secondary | ICD-10-CM | POA: Insufficient documentation

## 2017-03-25 DIAGNOSIS — Z923 Personal history of irradiation: Secondary | ICD-10-CM | POA: Insufficient documentation

## 2017-03-25 DIAGNOSIS — K449 Diaphragmatic hernia without obstruction or gangrene: Secondary | ICD-10-CM | POA: Diagnosis not present

## 2017-03-25 DIAGNOSIS — Z7984 Long term (current) use of oral hypoglycemic drugs: Secondary | ICD-10-CM | POA: Diagnosis not present

## 2017-03-25 DIAGNOSIS — R634 Abnormal weight loss: Secondary | ICD-10-CM

## 2017-03-25 DIAGNOSIS — E119 Type 2 diabetes mellitus without complications: Secondary | ICD-10-CM | POA: Diagnosis not present

## 2017-03-25 HISTORY — PX: ESOPHAGEAL BANDING: SHX5518

## 2017-03-25 HISTORY — PX: ESOPHAGOGASTRODUODENOSCOPY (EGD) WITH PROPOFOL: SHX5813

## 2017-03-25 LAB — GLUCOSE, CAPILLARY
GLUCOSE-CAPILLARY: 104 mg/dL — AB (ref 65–99)
Glucose-Capillary: 97 mg/dL (ref 65–99)

## 2017-03-25 SURGERY — ESOPHAGOGASTRODUODENOSCOPY (EGD) WITH PROPOFOL
Anesthesia: Monitor Anesthesia Care

## 2017-03-25 MED ORDER — MIDAZOLAM HCL 2 MG/2ML IJ SOLN
INTRAMUSCULAR | Status: AC
  Filled 2017-03-25: qty 2

## 2017-03-25 MED ORDER — CHLORHEXIDINE GLUCONATE CLOTH 2 % EX PADS: 6.0000 | MEDICATED_PAD | Freq: Once | CUTANEOUS | Status: DC

## 2017-03-25 MED ORDER — PROPOFOL 10 MG/ML IV BOLUS
INTRAVENOUS | Status: AC
  Filled 2017-03-25: qty 140

## 2017-03-25 MED ORDER — LACTATED RINGERS IV SOLN
INTRAVENOUS | Status: DC
  Administered 2017-03-25: 07:00:00 via INTRAVENOUS

## 2017-03-25 MED ORDER — LIDOCAINE VISCOUS 2 % MT SOLN
OROMUCOSAL | Status: AC
  Filled 2017-03-25: qty 15

## 2017-03-25 MED ORDER — LIDOCAINE HCL (PF) 1 % IJ SOLN
INTRAMUSCULAR | Status: AC
  Filled 2017-03-25: qty 5

## 2017-03-25 MED ORDER — LIDOCAINE VISCOUS 2 % MT SOLN
15.0000 mL | Freq: Once | OROMUCOSAL | Status: AC
  Administered 2017-03-25: 15 mL via OROMUCOSAL

## 2017-03-25 MED ORDER — MIDAZOLAM HCL 2 MG/2ML IJ SOLN
1.0000 mg | INTRAMUSCULAR | Status: AC
  Administered 2017-03-25: 2 mg via INTRAVENOUS

## 2017-03-25 MED ORDER — FENTANYL CITRATE (PF) 100 MCG/2ML IJ SOLN
25.0000 ug | Freq: Once | INTRAMUSCULAR | Status: AC
  Administered 2017-03-25: 25 ug via INTRAVENOUS

## 2017-03-25 MED ORDER — PROPOFOL 500 MG/50ML IV EMUL
INTRAVENOUS | Status: DC | PRN
  Administered 2017-03-25: 100 ug/kg/min via INTRAVENOUS

## 2017-03-25 MED ORDER — FENTANYL CITRATE (PF) 100 MCG/2ML IJ SOLN
INTRAMUSCULAR | Status: AC
  Filled 2017-03-25: qty 2

## 2017-03-25 MED ORDER — PROPOFOL 10 MG/ML IV BOLUS
INTRAVENOUS | Status: DC | PRN
  Administered 2017-03-25: 20 mg via INTRAVENOUS

## 2017-03-25 NOTE — Discharge Instructions (Signed)
EGD Discharge instructions Please read the instructions outlined below and refer to this sheet in the next few weeks. These discharge instructions provide you with general information on caring for yourself after you leave the hospital. Your doctor may also give you specific instructions. While your treatment has been planned according to the most current medical practices available, unavoidable complications occasionally occur. If you have any problems or questions after discharge, please call your doctor. ACTIVITY  You may resume your regular activity but move at a slower pace for the next 24 hours.   Take frequent rest periods for the next 24 hours.   Walking will help expel (get rid of) the air and reduce the bloated feeling in your abdomen.   No driving for 24 hours (because of the anesthesia (medicine) used during the test).   You may shower.   Do not sign any important legal documents or operate any machinery for 24 hours (because of the anesthesia used during the test).  NUTRITION  Drink plenty of fluids.   You may resume your normal diet.   Begin with a light meal and progress to your normal diet.   Avoid alcoholic beverages for 24 hours or as instructed by your caregiver.  MEDICATIONS  You may resume your normal medications unless your caregiver tells you otherwise.  WHAT YOU CAN EXPECT TODAY  You may experience abdominal discomfort such as a feeling of fullness or gas pains.  FOLLOW-UP  Your doctor will discuss the results of your test with you.  SEEK IMMEDIATE MEDICAL ATTENTION IF ANY OF THE FOLLOWING OCCUR:  Excessive nausea (feeling sick to your stomach) and/or vomiting.   Severe abdominal pain and distention (swelling).   Trouble swallowing.   Temperature over 101 F (37.8 C).   Rectal bleeding or vomiting of blood.   Contrast CT of abdomen and pelvis to further evaluate weight loss  Office visit with [place] in 4 weeks to set up repeat  EGD  Esophagogastroduodenoscopy, Care After Refer to this sheet in the next few weeks. These instructions provide you with information about caring for yourself after your procedure. Your health care provider may also give you more specific instructions. Your treatment has been planned according to current medical practices, but problems sometimes occur. Call your health care provider if you have any problems or questions after your procedure. What can I expect after the procedure? After the procedure, it is common to have:  A sore throat.  Nausea.  Bloating.  Dizziness.  Fatigue.  Follow these instructions at home:  Do not eat or drink anything until the numbing medicine (local anesthetic) has worn off and your gag reflex has returned. You will know that the local anesthetic has worn off when you can swallow comfortably.  Do not drive for 24 hours if you received a medicine to help you relax (sedative).  If your health care provider took a tissue sample for testing during the procedure, make sure to get your test results. This is your responsibility. Ask your health care provider or the department performing the test when your results will be ready.  Keep all follow-up visits as told by your health care provider. This is important. Contact a health care provider if:  You cannot stop coughing.  You are not urinating.  You are urinating less than usual. Get help right away if:  You have trouble swallowing.  You cannot eat or drink.  You have throat or chest pain that gets worse.  You are  dizzy or light-headed.  You faint.  You have nausea or vomiting.  You have chills.  You have a fever.  You have severe abdominal pain.  You have black, tarry, or bloody stools. This information is not intended to replace advice given to you by your health care provider. Make sure you discuss any questions you have with your health care provider. Document Released: 08/27/2012  Document Revised: 02/16/2016 Document Reviewed: 08/04/2015 Elsevier Interactive Patient Education  [employer].

## 2017-03-25 NOTE — Interval H&P Note (Signed)
History and Physical Interval Note:  03/25/2017 7:24 AM  Melissa Rose  has presented today for surgery, with the diagnosis of cirrhosis, weight loss, esophageal varices, anemia  The various methods of treatment have been discussed with the patient and family. After consideration of risks, benefits and other options for treatment, the patient has consented to  Procedure(s) with comments: ESOPHAGOGASTRODUODENOSCOPY (EGD) WITH PROPOFOL (N/A) - 7:30am ESOPHAGEAL BANDING (N/A) - Esophageal varices banding as a surgical intervention .  The patient's history has been reviewed, patient examined, no change in status, stable for surgery.  I have reviewed the patient's chart and labs.  Questions were answered to the patient's satisfaction.     Melissa Rose  No change; f/u EGD tyoday per plan. The risks, benefits, limitations, alternatives and imponderables have been reviewed with the patient. Potential for esophageal dilation, biopsy, etc. have also been reviewed.  Questions have been answered. All parties agreeable.

## 2017-03-25 NOTE — H&P (View-Only) (Signed)
Primary Care Physician:  Neale Burly, MD  Primary Gastroenterologist:  Garfield Cornea, MD   Chief Complaint  Patient presents with  . Cirrhosis    HPI:  Melissa Rose is a 76 y.o. female here To schedule EGD for esophageal variceal banding. Patient underwent EGD on May 23 and had multiple columns of grade 3 esophageal varices status post multiple bands placed but incompletely eradicated. 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 Advise come back for EGD with banding in 4 weeks.  We initially met patient back in April for management of her cirrhosis and esophageal varices. She had presented to the hospital back in February with IDA requiring blood transfusions.Patient had outpatient workup for anemia with Dr. Ladona Horns. EGD/TCS done. Colonoscopy reported to be normal. EGD showed esophageal varices (no size/number reported), non bleeding. 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   When I saw her she was unaware of any history of cirrhosis. Noted second 2015 CT abdomen pelvis. She also had a chest CT with suspicious 11 mm left upper lobe pulmonary nodule which we arrange for follow-up chest CT which showed significant reduction in size of the nodule and no further follow-up required. She also had an abdominal ultrasound in April her by her PCP showing cirrhosis but no hepatoma.  Her current labs show stable hemoglobin of 9, hematocrit 29.8, MCV 83, platelets 116,000, white blood cell count 3.6. Her ferritin is low at 11, TIBC normal 350, iron saturation is low at 11%.  Continues to lose significant weight. She is down nearly 25 pounds since April 12.  8 pounds since last month. Weight 207 pounds back in 2015 down 157 today.  Decline in appetite. Eating spoonful of stuff. Husband states she's never been a big eater.. No pp abdominal pain. No n/v. No adjustment in her diabetes medications. Bowel movements  regular. No blood in the stool or melena.    Current Outpatient Prescriptions  Medication Sig Dispense Refill  . atorvastatin (LIPITOR) 10 MG tablet Take 10 mg by mouth at bedtime.     . Calcium Carb-Cholecalciferol (CALCIUM 600/VITAMIN D3) 600-800 MG-UNIT TABS Take 1 tablet by mouth daily.    Marland Kitchen donepezil (ARICEPT) 10 MG tablet Take 10 mg by mouth every evening.     . furosemide (LASIX) 20 MG tablet Take 20 mg by mouth daily.    Marland Kitchen glipiZIDE (GLUCOTROL) 5 MG tablet Take 5 mg by mouth 2 (two) times daily.    Marland Kitchen lactulose (CHRONULAC) 10 GM/15ML solution Take 10 g by mouth 2 (two) times daily.    Marland Kitchen levothyroxine (SYNTHROID, LEVOTHROID) 100 MCG tablet Take 100 mcg by mouth daily.     Marland Kitchen lisinopril (PRINIVIL,ZESTRIL) 10 MG tablet Take 10 mg by mouth daily.    . metFORMIN (GLUCOPHAGE) 1000 MG tablet Take 1,000 mg by mouth 2 (two) times daily.    . propranolol (INDERAL) 10 MG tablet Take 10 mg by mouth 2 (two) times daily.    . ranitidine (ZANTAC) 300 MG tablet Take 300 mg by mouth 2 (two) times daily.     Marland Kitchen spironolactone (ALDACTONE) 25 MG tablet Take 12.5 mg by mouth 2 (two) times daily.     No current facility-administered medications for this visit.     Allergies as of 03/18/2017  . (No Known Allergies)    Past Medical History:  Diagnosis Date  . Breast cancer ([place]) 2000   treated with chemo/radiation  . Diabetes mellitus without complication ([place])   .  Hyperlipidemia   . Hypertension   . Hypothyroidism     Past Surgical History:  Procedure Laterality Date  . BREAST SURGERY Right   . 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 COLONOSCOPY  11/2016   Dr. Ladona Horns: normal  . ESOPHAGEAL BANDING N/A 02/13/2017   Procedure: ESOPHAGEAL BANDING;  Surgeon: Daneil Dolin, MD;  Location: [facility];  Service: Endoscopy;  Laterality: N/A;  . ESOPHAGOGASTRODUODENOSCOPY  11/2016   Dr. Ladona Horns: Short stricture in the first part of  the duodenum, not traversable., There were varices in the distal esophagus which were not bleeding.  . ESOPHAGOGASTRODUODENOSCOPY (EGD) WITH PROPOFOL N/A 02/13/2017   Procedure: ESOPHAGOGASTRODUODENOSCOPY (EGD) WITH PROPOFOL;  Surgeon: Daneil Dolin, MD;  Location: [facility];  Service: Endoscopy;  Laterality: N/A;  145     Family History  Problem Relation Age of Onset  . Diabetes Mother   . Diabetes Father   . Heart disease Father   . Cerebral palsy Sister   . Liver disease Neg Hx   . Colon cancer Neg Hx     Social History   Social History  . Marital status: Married    Spouse name: N/A  . Number of children: N/A  . Years of education: N/A   Occupational History  . Not on file.   Social History Main Topics  . Smoking status: Never Smoker  . Smokeless tobacco: Never Used  . Alcohol use No  . Drug use: No  . Sexual activity: Not on file   Other Topics Concern  . Not on file   Social History Narrative  . No narrative on file      ROS:  General: Negative for   fever, chills, fatigue, weakness.See history of present illness Eyes: Negative for vision changes.  ENT: Negative for hoarseness, difficulty swallowing , nasal congestion. CV: Negative for chest pain, angina, palpitations, dyspnea on exertion, peripheral edema.  Respiratory: Negative for dyspnea at rest, dyspnea on exertion, cough, sputum, wheezing.  GI: See history of present illness. GU:  Negative for dysuria, hematuria, urinary incontinence, urinary frequency, nocturnal urination.  MS: Negative for joint pain, low back pain.  Derm: Negative for rash or itching.  Neuro: Negative for weakness, abnormal sensation, seizure, frequent headaches, memory loss, confusion.  Psych: Negative for anxiety, depression, suicidal ideation, hallucinations.  Endo: See history of present illness Heme: Negative for bruising or bleeding. Allergy: Negative for rash or hives.    Physical Examination:  BP (!) 104/51    Pulse (!) 59   Temp 97 F (36.1 C) (Oral)   Ht 5' 4"  (1.626 m)   Wt 157 lb 9.6 oz (71.5 kg)   BMI 27.05 kg/m    General: Well-nourished, well-developed in no acute distress. Accompanied by spouse. Both are very difficult historians. Head: Normocephalic, atraumatic.   Eyes: Conjunctiva pink, no icterus. Mouth: Oropharyngeal mucosa moist and pink , no lesions erythema or exudate. Neck: Supple without thyromegaly, masses, or lymphadenopathy.  Lungs: Clear to auscultation bilaterally.  Heart: Regular rate and rhythm, no murmurs rubs or gallops.  Abdomen: Bowel sounds are normal, nontender, nondistended, no hepatosplenomegaly or masses, no abdominal bruits or    hernia , no rebound or guarding.   Rectal: Not performed Extremities: No lower extremity edema. No clubbing or deformities.  Neuro: Alert and oriented x 4 , grossly normal neurologically.  Skin: Warm and dry, no rash or jaundice.   Psych:  Alert and cooperative, normal mood and affect.  Labs: Lab Results  Component Value Date   CREATININE 1.23 (H) 02/08/2017   BUN 13 02/08/2017   NA 134 (L) 02/08/2017   K 3.8 02/08/2017   CL 107 02/08/2017   CO2 23 02/08/2017   Labs from February 2018, total bilirubin 0.6, alkaline phosphatase 121, AST 27, ALT 21, albumin 2.1, INR 1.2.  Lab Results  Component Value Date   WBC 3.6 03/14/2017   HGB 9.0 (L) 03/14/2017   HCT 29.8 (L) 03/14/2017   MCV 83 03/14/2017   PLT 116 (L) 03/14/2017   Lab Results  Component Value Date   IRON 37 03/14/2017   TIBC 350 03/14/2017   FERRITIN 11 (L) 03/14/2017     Imaging Studies: Ct Chest W Contrast  Result Date: 03/01/2017 CLINICAL DATA:  Follow-up pulmonary nodule EXAM: CT CHEST WITH CONTRAST TECHNIQUE: Multidetector CT imaging of the chest was performed during intravenous contrast administration. CONTRAST:  49m ISOVUE-300 IOPAMIDOL (ISOVUE-300) INJECTION 61% COMPARISON:  07/06/2014 FINDINGS: Cardiovascular: Thoracic aorta and its branches  demonstrate atherosclerotic calcifications. No aneurysmal dilatation is noted. No cardiac enlargement is seen. Mediastinum/Nodes: Thoracic inlet is within normal limits. No hilar or mediastinal adenopathy is noted. The esophagus as visualized is within normal limits with the exception of multiple esophageal varices distally. Lungs/Pleura: The lungs are well aerated bilaterally. The previously seen left upper lobe nodule has regressed significantly in the interval from the prior exam with only a small residual scar remaining. No new focal nodules are seen. No focal infiltrate is noted. Mild dependent atelectatic changes are noted in the bases. No sizable effusion is seen. Upper Abdomen: Cirrhotic changes of the liver are noted with enlargement of the portal vein. Multiple gastric and esophageal varices are seen. Musculoskeletal: Degenerative changes of the thoracic spine are noted. 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 Significant reduction in size in previously seen left upper lobe nodule. No further follow-up is required. No new nodule is seen. Electronically Signed   By: MInez CatalinaM.D.   On: 03/01/2017 14:04

## 2017-03-25 NOTE — Op Note (Signed)
[facility] Patient Name: Melissa Rose Procedure Date: 03/25/2017 7:20 AM MRN: [id number] Date of Birth: 08/30/1941 Attending MD: Norvel Richards , MD CSN: [id number] Age: 76 Admit Type: Outpatient Procedure:                Upper GI endoscopy Indications:              Screening procedure Providers:                Norvel Richards, MD, Jeanann Lewandowsky. Sharon Seller, RN,                            Randa Spike, Technician Referring MD:              Medicines:                Propofol per Anesthesia Complications:            No immediate complications. Estimated Blood Loss:     Estimated blood loss: none. Procedure:                Pre-Anesthesia Assessment:                           - Prior to the procedure, a History and Physical                            was performed, and patient medications and                            allergies were reviewed. The patient's tolerance of                            previous anesthesia was also reviewed. The risks                            and benefits of the procedure and the sedation                            options and risks were discussed with the patient.                            All questions were answered, and informed consent                            was obtained. Prior Anticoagulants: The patient has                            taken no previous anticoagulant or antiplatelet                            agents. ASA Grade Assessment: III - A patient with                            severe systemic disease. After reviewing the risks  and benefits, the patient was deemed in                            satisfactory condition to undergo the procedure.                           After obtaining informed consent, the endoscope was                            passed under direct vision. Throughout the                            procedure, the patient's blood pressure, pulse, and                            oxygen  saturations were monitored continuously. The                            [id number]) scope was introduced through the                            and advanced to the second part of duodenum. The                            upper GI endoscopy was accomplished without                            difficulty. The patient tolerated the procedure                            well. Scope In: 7:41:16 AM Scope Out: 7:51:59 AM Total Procedure Duration: 0 hours 10 minutes 43 seconds  Findings:      Grade III varices were found in the middle third of the esophagus.       Multiple columns. Some overlying red marks.      A medium-sized hiatal hernia was present.      Portal hypertensive gastropathy was found in the entire examined stomach.      non-critical post bulbar stricture present. It appeared benign as       previously encountered. Lumen patent. This area was easily traversed       with the scope. Six bands were successfully placed with incomplete       eradication of varices. Impression:               - Grade III esophageal varices. Incompletely                            eradicated. Banded.                           - Medium-sized hiatal hernia.                           - Portal hypertensive gastropathy.                           -  duodenal stenosis - noncritical.                           - No specimens collected. today's findings would                            not explain weight loss. Moderate Sedation:      Moderate (conscious) sedation was personally administered by an       anesthesia professional. The following parameters were monitored: oxygen       saturation, heart rate, blood pressure, respiratory rate, EKG, adequacy       of pulmonary ventilation, and response to care. Total physician       intraservice time was 19 minutes. Recommendation:           - Patient has a contact number available for                            emergencies. The signs and symptoms of potential                             delayed complications were discussed with the                            patient. Return to normal activities tomorrow.                            Written discharge instructions were provided to the                            patient.                           - Advance diet as tolerated.                           - Continue present medications.                           - Repeat upper endoscopy in 4 weeks for                            surveillance and likely repeat banding..                           - Return to GI clinic in 4 weeks. CT the abdomen                            and pelvis in the near future to further evaluate                            weight loss. Procedure Code(s):        --- Professional ---                           [phone], Esophagogastroduodenoscopy, flexible,  transoral; with band ligation of esophageal/gastric                            varices Diagnosis Code(s):        --- Professional ---                           I85.00, Esophageal varices without bleeding                           K44.9, Diaphragmatic hernia without obstruction or                            gangrene                           K76.6, Portal hypertension                           K31.89, Other diseases of stomach and duodenum                           Z13.810, Encounter for screening for upper                            gastrointestinal disorder CPT copyright 2016 [employer]. All rights reserved. The codes documented in this report are preliminary and upon coder review may  be revised to meet current compliance requirements. [name]. Devaughn Savant, MD Norvel Richards, MD 03/25/2017 8:07:15 AM This report has been signed electronically. Number of Addenda: 0

## 2017-03-25 NOTE — Transfer of Care (Signed)
Immediate Anesthesia Transfer of Care Note  Patient: Melissa Rose  Procedure(s) Performed: Procedure(s) with comments: ESOPHAGOGASTRODUODENOSCOPY (EGD) WITH PROPOFOL (N/A) - 7:30am ESOPHAGEAL BANDING (N/A) - Esophageal varices banding  Patient Location: PACU  Anesthesia Type:MAC  Level of Consciousness: sedated and patient cooperative  Airway & Oxygen Therapy: Patient Spontanous Breathing and non-rebreather face mask  Post-op Assessment: Report given to RN and Post -op Vital signs reviewed and stable  Post vital signs: Reviewed and stable  Last Vitals:  Vitals:   03/25/17 0653 03/25/17 0705  BP: (!) 116/46 116/73  Pulse: (!) 52 76  Resp: (!) 22 (!) 24  Temp: 36.4 C     Last Pain:  Vitals:   03/25/17 0653  TempSrc: Oral  PainSc: 5          Complications: No apparent anesthesia complications

## 2017-03-25 NOTE — Anesthesia Postprocedure Evaluation (Signed)
Anesthesia Post Note  Patient: Melissa Rose  Procedure(s) Performed: Procedure(s) (LRB): ESOPHAGOGASTRODUODENOSCOPY (EGD) WITH PROPOFOL (N/A) ESOPHAGEAL BANDING (N/A)  Patient location during evaluation: PACU Anesthesia Type: MAC Level of consciousness: awake and alert Pain management: pain level controlled Vital Signs Assessment: post-procedure vital signs reviewed and stable Respiratory status: spontaneous breathing Cardiovascular status: stable Postop Assessment: no signs of nausea or vomiting Anesthetic complications: no     Last Vitals:  Vitals:   03/25/17 0830 03/25/17 0841  BP: 124/60 (!) 123/54  Pulse: (!) 57 (!) 55  Resp: 15 16  Temp:  36.4 C    Last Pain:  Vitals:   03/25/17 0830  TempSrc:   PainSc: 0-No pain                 Soyla Bainter

## 2017-03-25 NOTE — Anesthesia Preprocedure Evaluation (Signed)
Anesthesia Evaluation  Patient identified by MRN, date of birth, ID band Patient awake  General Assessment Comment:Somewhat weak and lethargic, but responds appropriately.  Reviewed: Allergy & Precautions, NPO status , Patient's Chart, lab work & pertinent test results  Airway Mallampati: II  TM Distance: >3 FB     Dental  (+) Partial Lower, Partial Upper   Pulmonary neg pulmonary ROS,  pulm nodules   breath sounds clear to auscultation       Cardiovascular hypertension, Pt. on medications and Pt. 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)  Esophageal Varices    ,   Endo/Other  diabetes, Type 2Hypothyroidism   Renal/GU      Musculoskeletal   Abdominal   Peds  Hematology  (+) anemia ,   Anesthesia Other Findings Breast Cancer   Reproductive/Obstetrics                             Anesthesia Physical Anesthesia Plan  ASA: III  Anesthesia Plan: MAC   Post-op Pain Management:    Induction: Intravenous  PONV Risk Score and Plan:   Airway Management Planned: Simple Face Mask  Additional Equipment:   Intra-op Plan:   Post-operative Plan:   Informed Consent: I have reviewed the patients History and Physical, chart, labs and discussed the procedure including the risks, benefits and alternatives for the proposed anesthesia with the patient or authorized representative who has indicated his/her understanding and acceptance.     Plan Discussed with:   Anesthesia Plan Comments:         Anesthesia Quick Evaluation

## 2017-03-25 NOTE — Anesthesia Procedure Notes (Signed)
Procedure Name: MAC Date/Time: 03/25/2017 7:29 AM Performed by: Vista Deck Pre-anesthesia Checklist: Patient identified, Emergency Drugs available, Suction available, Timeout performed and Patient being monitored Patient Re-evaluated:Patient Re-evaluated prior to inductionOxygen Delivery Method: Non-rebreather mask

## 2017-03-29 ENCOUNTER — Encounter (HOSPITAL_COMMUNITY): Payer: Self-pay | Admitting: Internal Medicine

## 2017-04-10 ENCOUNTER — Telehealth: Payer: Self-pay | Admitting: Internal Medicine

## 2017-04-10 NOTE — Telephone Encounter (Signed)
Melissa Rose schedule the patient to see RMR 8/7 at 4 pm

## 2017-04-10 NOTE — Telephone Encounter (Signed)
Repeat upper endoscopy in 4 weeks for surveillance and likely repeat banding.. - Return to GI clinic in 4 weeks. (per RMR recommendations in 03/25/17 procedure note) (next available appointments are in September. Where can I schedule this patient to come in?)

## 2017-04-11 ENCOUNTER — Encounter: Payer: Self-pay | Admitting: Internal Medicine

## 2017-04-11 NOTE — Telephone Encounter (Signed)
OV made and letter mailed °

## 2017-04-11 NOTE — Telephone Encounter (Signed)
OV made and letter mailed. OV on 8/7 at 4 with RMR per CM

## 2017-04-19 DIAGNOSIS — K7460 Unspecified cirrhosis of liver: Secondary | ICD-10-CM | POA: Diagnosis not present

## 2017-04-20 LAB — COMPREHENSIVE METABOLIC PANEL
A/G RATIO: 0.6 — AB (ref 1.2–2.2)
ALBUMIN: 2.7 g/dL — AB (ref 3.5–4.8)
ALK PHOS: 234 IU/L — AB (ref 39–117)
ALT: 21 IU/L (ref 0–32)
AST: 36 IU/L (ref 0–40)
BILIRUBIN TOTAL: 0.9 mg/dL (ref 0.0–1.2)
BUN / CREAT RATIO: 8 — AB (ref 12–28)
BUN: 12 mg/dL (ref 8–27)
CHLORIDE: 102 mmol/L (ref 96–106)
CO2: 21 mmol/L (ref 20–29)
Calcium: 8.8 mg/dL (ref 8.7–10.3)
Creatinine, Ser: 1.43 mg/dL — ABNORMAL HIGH (ref 0.57–1.00)
GFR calc non Af Amer: 36 mL/min/{1.73_m2} — ABNORMAL LOW (ref >59)
GFR, EST AFRICAN AMERICAN: 41 mL/min/{1.73_m2} — AB (ref >59)
GLOBULIN, TOTAL: 4.3 g/dL (ref 1.5–4.5)
Glucose: 381 mg/dL — ABNORMAL HIGH (ref 65–99)
POTASSIUM: 4.4 mmol/L (ref 3.5–5.2)
SODIUM: 133 mmol/L — AB (ref 134–144)
TOTAL PROTEIN: 7 g/dL (ref 6.0–8.5)

## 2017-04-20 LAB — CBC WITH DIFFERENTIAL/PLATELET
Basophils Absolute: 0.1 x10E3/uL (ref 0.0–0.2)
Basos: 3 %
EOS (ABSOLUTE): 0.3 x10E3/uL (ref 0.0–0.4)
Eos: 10 %
Hematocrit: 28.9 % — ABNORMAL LOW (ref 34.0–46.6)
Hemoglobin: 9.1 g/dL — ABNORMAL LOW (ref 11.1–15.9)
Immature Grans (Abs): 0 x10E3/uL (ref 0.0–0.1)
Immature Granulocytes: 0 %
Lymphocytes Absolute: 0.7 x10E3/uL (ref 0.7–3.1)
Lymphs: 23 %
MCH: 25.3 pg — ABNORMAL LOW (ref 26.6–33.0)
MCHC: 31.5 g/dL (ref 31.5–35.7)
MCV: 81 fL (ref 79–97)
Monocytes Absolute: 0.4 x10E3/uL (ref 0.1–0.9)
Monocytes: 12 %
Neutrophils Absolute: 1.7 x10E3/uL (ref 1.4–7.0)
Neutrophils: 52 %
Platelets: 90 x10E3/uL — CL (ref 150–379)
RBC: 3.59 x10E6/uL — ABNORMAL LOW (ref 3.77–5.28)
RDW: 16.1 % — ABNORMAL HIGH (ref 12.3–15.4)
WBC: 3.3 x10E3/uL — ABNORMAL LOW (ref 3.4–10.8)

## 2017-04-20 LAB — AFP TUMOR MARKER: AFP, Serum, Tumor Marker: 1.8 ng/mL (ref 0.0–8.3)

## 2017-04-20 LAB — PROTIME-INR
INR: 1.2 (ref 0.8–1.2)
Prothrombin Time: 12.7 s — ABNORMAL HIGH (ref 9.1–12.0)

## 2017-04-20 LAB — HEPATITIS B SURFACE ANTIBODY,QUALITATIVE: Hep B Surface Ab, Qual: NONREACTIVE

## 2017-04-20 LAB — HEPATITIS A ANTIBODY, TOTAL: HEP A TOTAL AB: POSITIVE — AB

## 2017-04-20 LAB — HEPATITIS B CORE ANTIBODY, TOTAL: Hep B Core Total Ab: NEGATIVE

## 2017-04-20 LAB — FERRITIN: Ferritin: 10 ng/mL — ABNORMAL LOW (ref 15–150)

## 2017-04-20 LAB — HEPATITIS B SURFACE ANTIGEN: Hepatitis B Surface Ag: NEGATIVE

## 2017-04-20 LAB — HEPATITIS C ANTIBODY

## 2017-04-30 ENCOUNTER — Ambulatory Visit (INDEPENDENT_AMBULATORY_CARE_PROVIDER_SITE_OTHER): Payer: Medicare Other | Admitting: Internal Medicine

## 2017-04-30 ENCOUNTER — Encounter: Payer: Self-pay | Admitting: Internal Medicine

## 2017-04-30 ENCOUNTER — Other Ambulatory Visit: Payer: Self-pay

## 2017-04-30 VITALS — BP 127/53 | HR 65 | Temp 98.3°F | Ht 64.0 in | Wt 162.2 lb

## 2017-04-30 DIAGNOSIS — K7469 Other cirrhosis of liver: Secondary | ICD-10-CM | POA: Diagnosis not present

## 2017-04-30 DIAGNOSIS — I8510 Secondary esophageal varices without bleeding: Secondary | ICD-10-CM

## 2017-04-30 DIAGNOSIS — K7460 Unspecified cirrhosis of liver: Secondary | ICD-10-CM

## 2017-04-30 NOTE — Patient Instructions (Addendum)
Schedule an EGD with EBL - esophageal varices - propofol  Need vaccination against hepatitis B  See Dr. Freida Busman regarding rash  Continue lactulose  Hold evening  Glucotrol and Glucophage the evening before and morning of the procedure.  Further recommendations to follow

## 2017-04-30 NOTE — Progress Notes (Signed)
Primary Care Physician:  Neale Burly, MD Primary Gastroenterologist:  Dr. Gala Romney  Pre-Procedure History & Physical: HPI:  Melissa Rose is a 76 y.o. female here for follow-up of cirrhosis with esophageal varices status post EVL back in June. She's here to set up follow-up session. She is doing well she is immune to hepatitis A; susceptible to hepatitis B. Takes lactulose daily with 1-3 semi-formed bowel movements daily.  Past Medical History:  Diagnosis Date  . Arthritis   . Breast cancer ([place]) 2000   treated with chemo/radiation  . Dementia    slight  . Diabetes mellitus without complication ([place])   . GERD (gastroesophageal reflux disease)   . Hyperlipidemia   . Hypertension   . Hypothyroidism     Past Surgical History:  Procedure Laterality Date  . BREAST SURGERY Right   . 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 COLONOSCOPY  11/2016   Dr. Ladona Horns: normal  . ESOPHAGEAL BANDING N/A 02/13/2017   Procedure: ESOPHAGEAL BANDING;  Surgeon: Daneil Dolin, MD;  [facility];  Service: Endoscopy;  Laterality: N/A;  . ESOPHAGEAL BANDING N/A 03/25/2017   Procedure: ESOPHAGEAL BANDING;  Surgeon: Daneil Dolin, MD;  Location: [facility];  Service: Endoscopy;  Laterality: N/A;  Esophageal varices banding  . ESOPHAGOGASTRODUODENOSCOPY  11/2016   Dr. 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  . ESOPHAGOGASTRODUODENOSCOPY (EGD) WITH PROPOFOL N/A 02/13/2017   Procedure: ESOPHAGOGASTRODUODENOSCOPY (EGD) WITH PROPOFOL;  Surgeon: Daneil Dolin, MD;  [facility];  Service: Endoscopy;  Laterality: N/A;  145   . ESOPHAGOGASTRODUODENOSCOPY (EGD) WITH PROPOFOL N/A 03/25/2017   Procedure: ESOPHAGOGASTRODUODENOSCOPY (EGD) WITH PROPOFOL;  Surgeon: Daneil Dolin, MD;  [facility]: [facility];  Service:  Endoscopy;  Laterality: N/A;  7:30am    Prior to Admission medications   Medication Sig Start Date End Date Taking? Authorizing Provider  atorvastatin (LIPITOR) 10 MG tablet Take 10 mg by mouth at bedtime.  11/02/16  Yes [provider]  Calcium Carb-Cholecalciferol (CALCIUM 600/VITAMIN D3) 600-800 MG-UNIT TABS Take 1 tablet by mouth daily.   Yes [provider]  donepezil (ARICEPT) 10 MG tablet Take 10 mg by mouth every evening.  11/02/16  Yes [provider]  furosemide (LASIX) 20 MG tablet Take 20 mg by mouth daily.   Yes [provider]  glipiZIDE (GLUCOTROL) 5 MG tablet Take 5 mg by mouth 2 (two) times daily. 04/27/14  Yes [provider]  lactulose (CHRONULAC) 10 GM/15ML solution Take 10 g by mouth 2 (two) times daily.   Yes [provider]  levothyroxine (SYNTHROID, LEVOTHROID) 100 MCG tablet Take 100 mcg by mouth daily.  11/02/16  Yes [provider]  lisinopril (PRINIVIL,ZESTRIL) 10 MG tablet Take 10 mg by mouth daily. 04/27/14  Yes [provider]  metFORMIN (GLUCOPHAGE) 1000 MG tablet Take 1,000 mg by mouth 2 (two) times daily.   Yes [provider]  propranolol (INDERAL) 10 MG tablet Take 10 mg by mouth 2 (two) times daily.   Yes [provider]  ranitidine (ZANTAC) 300 MG tablet Take 300 mg by mouth 2 (two) times daily.  11/02/16  Yes [provider]  spironolactone (ALDACTONE) 25 MG tablet Take 12.5 mg by mouth 2 (two) times daily.   Yes [provider]  Allergies as of 04/30/2017  . (No Known Allergies)    Family History  Problem Relation Age of Onset  . Diabetes Mother   . Diabetes Father   . Heart disease Father   . Cerebral palsy Sister   . Liver disease Neg Hx   . Colon cancer Neg Hx     Social History   Social History  . Marital status: Married    Spouse name: N/A  . Number of children: N/A  . Years of education: N/A   Occupational History  . Not on file.    Social History Main Topics  . Smoking status: Never Smoker  . Smokeless tobacco: Never Used  . Alcohol use No  . Drug use: No  . Sexual activity: Not Currently    Birth control/ protection: None   Other Topics Concern  . Not on file   Social History Narrative  . No narrative on file    Review of Systems: See HPI, otherwise negative ROS  Physical Exam: BP (!) 127/53   Pulse 65   Temp 98.3 F (36.8 C) (Oral)   Ht 5\' 4"  (1.626 m)   Wt 162 lb 3.2 oz (73.6 kg)   BMI 27.84 kg/m  General:   Alert,  , pleasant and cooperative in NAD. Well oriented. No asterixis. Neck:  Supple; no masses or thyromegaly. No significant cervical adenopathy. Lungs:  Clear throughout to auscultation.   No wheezes, crackles, or rhonchi. No acute distress. Heart:  Regular rate and rhythm; no murmurs, clicks, rubs,  or gallops. Abdomen: Non-distended, normal bowel sounds.  Soft and nontender without appreciable mass or hepatosplenomegaly.  Pulses:  Normal pulses noted. Extremities:  Without clubbing or edema.  Impression:  76 year old lady with cirrhosis and esophageal varices. Underwent EVl band ligation recently. Needs a follow-up session now. She remains fairly well compensated. She needs hepatitis B vaccine. She has a rash for which needs follow-up with Dr. Idamae Schuller.  Pulmonary lesion has shrunken. No further evaluation warranted.  Significant weight loss continues to be concerned. Has not had abdominal pelvic CT as of yet.  Recommendations: Schedule an EGD with EBL - esophageal varices - propofol.  The risks, benefits, limitations, alternatives and imponderables have been reviewed with the patient. Potential for esophageal dilation, biopsy, etc. have also been reviewed.  Questions have been answered. All parties agreeable.  Need vaccination against hepatitis B  See Dr. Freida Busman regarding rash  Continue lactulose  Hold  Glucotrol and Glucophage the evening before and morning of the  procedure.  Further recommendations to follow        Notice: This dictation was prepared with Dragon dictation along with smaller phrase technology. Any transcriptional errors that result from this process are unintentional and may not be corrected upon review.

## 2017-04-30 NOTE — Patient Instructions (Signed)
PA info for EGD/Esoph varices banding submitted via [facility] website. No PA needed. Decision ID# [id number].

## 2017-05-01 ENCOUNTER — Telehealth: Payer: Self-pay

## 2017-05-01 NOTE — Telephone Encounter (Signed)
Called and informed pt's husband of pre-op appt 05/29/17 at 10:00am. Letter mailed.

## 2017-05-02 DIAGNOSIS — E784 Other hyperlipidemia: Secondary | ICD-10-CM | POA: Diagnosis not present

## 2017-05-02 DIAGNOSIS — I872 Venous insufficiency (chronic) (peripheral): Secondary | ICD-10-CM | POA: Diagnosis not present

## 2017-05-02 DIAGNOSIS — E119 Type 2 diabetes mellitus without complications: Secondary | ICD-10-CM | POA: Diagnosis not present

## 2017-05-02 DIAGNOSIS — I10 Essential (primary) hypertension: Secondary | ICD-10-CM | POA: Diagnosis not present

## 2017-05-05 NOTE — Progress Notes (Signed)
Patient seen by rmr since labs.  Meld 17. Plans for hep b vaccine with PCP.  Ferritin low but stable, cbc stable.   Repeat CBC, ferritin in 3 months. DX: IDA Repeat CMET, PT/INR in six moths.  Due ruq u/s in 08/2017 for hepatoma surveillance.

## 2017-05-06 ENCOUNTER — Other Ambulatory Visit: Payer: Self-pay | Admitting: Gastroenterology

## 2017-05-06 DIAGNOSIS — D509 Iron deficiency anemia, unspecified: Secondary | ICD-10-CM

## 2017-05-06 DIAGNOSIS — K7460 Unspecified cirrhosis of liver: Secondary | ICD-10-CM

## 2017-05-06 NOTE — Progress Notes (Signed)
ON RECALL  °

## 2017-05-22 NOTE — Patient Instructions (Signed)
Melissa Rose  05/22/2017     @PREFPERIOPPHARMACY @   Your procedure is scheduled on  06/03/2017 .  Report to [facility] at  700  A.M.  Call this number if you have problems the morning of surgery:  [phone]   Remember:  Do not eat food or drink liquids after midnight.  Take these medicines the morning of surgery with A SIP OF WATER  Levothyroxine, lisinopril, inderal, zantac.   Do not wear jewelry, make-up or nail polish.  Do not wear lotions, powders, or perfumes, or deoderant.  Do not shave 48 hours prior to surgery.  Men may shave face and neck.  Do not bring valuables to the hospital.  [facility] is not responsible for any belongings or valuables.  Contacts, dentures or bridgework may not be worn into surgery.  Leave your suitcase in the car.  After surgery it may be brought to your room.  For patients admitted to the hospital, discharge time will be determined by your treatment team.  Patients discharged the day of surgery will not be allowed to drive home.   Name and phone number of your driver:   family Special instructions:  Follow the diet and prep instructions given to you by Dr Roseanne Kaufman  Please read over the following fact sheets that you were given. Anesthesia Post-op Instructions and Care and Recovery After Surgery       Esophagogastroduodenoscopy Esophagogastroduodenoscopy (EGD) is a procedure to examine the lining of the esophagus, stomach, and first part of the small intestine (duodenum). This procedure is done to check for problems such as inflammation, bleeding, ulcers, or growths. During this procedure, a long, flexible, lighted tube with a camera attached (endoscope) is inserted down the throat. Tell a health care provider about:  Any allergies you have.  All medicines you are taking, including vitamins, herbs, eye drops, creams, and over-the-counter medicines.  Any problems you or family members have had with anesthetic  medicines.  Any blood disorders you have.  Any surgeries you have had.  Any medical conditions you have.  Whether you are pregnant or may be pregnant. What are the risks? Generally, this is a safe procedure. However, problems may occur, including:  Infection.  Bleeding.  A tear (perforation) in the esophagus, stomach, or duodenum.  Trouble breathing.  Excessive sweating.  Spasms of the larynx.  A slowed heartbeat.  Low blood pressure.  What happens before the procedure?  Follow instructions from your health care provider about eating or drinking restrictions.  Ask your health care provider about: ? Changing or stopping your regular medicines. This is especially important if you are taking diabetes medicines or blood thinners. ? Taking medicines such as aspirin and ibuprofen. These medicines can thin your blood. Do not take these medicines before your procedure if your health care provider instructs you not to.  Plan to have someone take you home after the procedure.  If you wear dentures, be ready to remove them before the procedure. What happens during the procedure?  To reduce your risk of infection, your health care team will wash or sanitize their hands.  An IV tube will be put in a vein in your hand or arm. You will get medicines and fluids through this tube.  You will be given one or more of the following: ? A medicine to help you relax (sedative). ? A medicine to numb the area (local anesthetic). This medicine may be sprayed  into your throat. It will make you feel more comfortable and keep you from gagging or coughing during the procedure. ? A medicine for pain.  A mouth guard may be placed in your mouth to protect your teeth and to keep you from biting on the endoscope.  You will be asked to lie on your left side.  The endoscope will be lowered down your throat into your esophagus, stomach, and duodenum.  Air will be put into the endoscope. This will  help your health care provider see better.  The lining of your esophagus, stomach, and duodenum will be examined.  Your health care provider may: ? Take a tissue sample so it can be looked at in a lab (biopsy). ? Remove growths. ? Remove objects (foreign bodies) that are stuck. ? Treat any bleeding with medicines or other devices that stop tissue from bleeding. ? Widen (dilate) or stretch narrowed areas of your esophagus and stomach.  The endoscope will be taken out. The procedure may vary among health care providers and hospitals. What happens after the procedure?  Your blood pressure, heart rate, breathing rate, and blood oxygen level will be monitored often until the medicines you were given have worn off.  Do not eat or drink anything until the numbing medicine has worn off and your gag reflex has returned. This information is not intended to replace advice given to you by your health care provider. Make sure you discuss any questions you have with your health care provider. Document Released: 01/11/2005 Document Revised: 02/16/2016 Document Reviewed: 08/04/2015 Elsevier Interactive Patient Education  2018 [employer]. Esophagogastroduodenoscopy, Care After Refer to this sheet in the next few weeks. These instructions provide you with information about caring for yourself after your procedure. Your health care provider may also give you more specific instructions. Your treatment has been planned according to current medical practices, but problems sometimes occur. Call your health care provider if you have any problems or questions after your procedure. What can I expect after the procedure? After the procedure, it is common to have:  A sore throat.  Nausea.  Bloating.  Dizziness.  Fatigue.  Follow these instructions at home:  Do not eat or drink anything until the numbing medicine (local anesthetic) has worn off and your gag reflex has returned. You will know that the  local anesthetic has worn off when you can swallow comfortably.  Do not drive for 24 hours if you received a medicine to help you relax (sedative).  If your health care provider took a tissue sample for testing during the procedure, make sure to get your test results. This is your responsibility. Ask your health care provider or the department performing the test when your results will be ready.  Keep all follow-up visits as told by your health care provider. This is important. Contact a health care provider if:  You cannot stop coughing.  You are not urinating.  You are urinating less than usual. Get help right away if:  You have trouble swallowing.  You cannot eat or drink.  You have throat or chest pain that gets worse.  You are dizzy or light-headed.  You faint.  You have nausea or vomiting.  You have chills.  You have a fever.  You have severe abdominal pain.  You have black, tarry, or bloody stools. This information is not intended to replace advice given to you by your health care provider. Make sure you discuss any questions you have with your health  care provider. Document Released: 08/27/2012 Document Revised: 02/16/2016 Document Reviewed: 08/04/2015 Elsevier Interactive Patient Education  2018 [place] Anesthesia is a term that refers to techniques, procedures, and medicines that help a person stay safe and comfortable during a medical procedure. Monitored anesthesia care, or sedation, is one type of anesthesia. Your anesthesia specialist may recommend sedation if you will be having a procedure that does not require you to be unconscious, such as:  Cataract surgery.  A dental procedure.  A biopsy.  A colonoscopy.  During the procedure, you may receive a medicine to help you relax (sedative). There are three levels of sedation:  Mild sedation. At this level, you may feel awake and relaxed. You will be able to follow  directions.  Moderate sedation. At this level, you will be sleepy. You may not remember the procedure.  Deep sedation. At this level, you will be asleep. You will not remember the procedure.  The more medicine you are given, the deeper your level of sedation will be. Depending on how you respond to the procedure, the anesthesia specialist may change your level of sedation or the type of anesthesia to fit your needs. An anesthesia specialist will monitor you closely during the procedure. Let your health care provider know about:  Any allergies you have.  All medicines you are taking, including vitamins, herbs, eye drops, creams, and over-the-counter medicines.  Any use of steroids (by mouth or as a cream).  Any problems you or family members have had with sedatives and anesthetic medicines.  Any blood disorders you have.  Any surgeries you have had.  Any medical conditions you have, such as sleep apnea.  Whether you are pregnant or may be pregnant.  Any use of cigarettes, alcohol, or street drugs. What are the risks? Generally, this is a safe procedure. However, problems may occur, including:  Getting too much medicine (oversedation).  Nausea.  Allergic reaction to medicines.  Trouble breathing. If this happens, a breathing tube may be used to help with breathing. It will be removed when you are awake and breathing on your own.  Heart trouble.  Lung trouble.  Before the procedure Staying hydrated Follow instructions from your health care provider about hydration, which may include:  Up to 2 hours before the procedure - you may continue to drink clear liquids, such as water, clear fruit juice, black coffee, and plain tea.  Eating and drinking restrictions Follow instructions from your health care provider about eating and drinking, which may include:  8 hours before the procedure - stop eating heavy meals or foods such as meat, fried foods, or fatty foods.  6 hours  before the procedure - stop eating light meals or foods, such as toast or cereal.  6 hours before the procedure - stop drinking milk or drinks that contain milk.  2 hours before the procedure - stop drinking clear liquids.  Medicines Ask your health care provider about:  Changing or stopping your regular medicines. This is especially important if you are taking diabetes medicines or blood thinners.  Taking medicines such as aspirin and ibuprofen. These medicines can thin your blood. Do not take these medicines before your procedure if your health care provider instructs you not to.  Tests and exams  You will have a physical exam.  You may have blood tests done to show: ? How well your kidneys and liver are working. ? How well your blood can clot.  General instructions  Plan  to have someone take you home from the hospital or clinic.  If you will be going home right after the procedure, plan to have someone with you for 24 hours.  What happens during the procedure?  Your blood pressure, heart rate, breathing, level of pain and overall condition will be monitored.  An IV tube will be inserted into one of your veins.  Your anesthesia specialist will give you medicines as needed to keep you comfortable during the procedure. This may mean changing the level of sedation.  The procedure will be performed. After the procedure  Your blood pressure, heart rate, breathing rate, and blood oxygen level will be monitored until the medicines you were given have worn off.  Do not drive for 24 hours if you received a sedative.  You may: ? Feel sleepy, clumsy, or nauseous. ? Feel forgetful about what happened after the procedure. ? Have a sore throat if you had a breathing tube during the procedure. ? Vomit. This information is not intended to replace advice given to you by your health care provider. Make sure you discuss any questions you have with your health care provider. Document  Released: 06/06/2005 Document Revised: 02/17/2016 Document Reviewed: 01/01/2016 Elsevier Interactive Patient Education  2018 [place], Care After These instructions provide you with information about caring for yourself after your procedure. Your health care provider may also give you more specific instructions. Your treatment has been planned according to current medical practices, but problems sometimes occur. Call your health care provider if you have any problems or questions after your procedure. What can I expect after the procedure? After your procedure, it is common to:  Feel sleepy for several hours.  Feel clumsy and have poor balance for several hours.  Feel forgetful about what happened after the procedure.  Have poor judgment for several hours.  Feel nauseous or vomit.  Have a sore throat if you had a breathing tube during the procedure.  Follow these instructions at home: For at least 24 hours after the procedure:   Do not: ? Participate in activities in which you could fall or become injured. ? Drive. ? Use heavy machinery. ? Drink alcohol. ? Take sleeping pills or medicines that cause drowsiness. ? Make important decisions or sign legal documents. ? Take care of children on your own.  Rest. Eating and drinking  Follow the diet that is recommended by your health care provider.  If you vomit, drink water, juice, or soup when you can drink without vomiting.  Make sure you have little or no nausea before eating solid foods. General instructions  Have a responsible adult stay with you until you are awake and alert.  Take over-the-counter and prescription medicines only as told by your health care provider.  If you smoke, do not smoke without supervision.  Keep all follow-up visits as told by your health care provider. This is important. Contact a health care provider if:  You keep feeling nauseous or you keep  vomiting.  You feel light-headed.  You develop a rash.  You have a fever. Get help right away if:  You have trouble breathing. This information is not intended to replace advice given to you by your health care provider. Make sure you discuss any questions you have with your health care provider. Document Released: 01/01/2016 Document Revised: 05/02/2016 Document Reviewed: 01/01/2016 Elsevier Interactive Patient Education  [employer].

## 2017-05-29 ENCOUNTER — Encounter (HOSPITAL_COMMUNITY)
Admission: RE | Admit: 2017-05-29 | Discharge: 2017-05-29 | Disposition: A | Discharge status: Home / Self Care | Payer: Medicare Other | Source: Ambulatory Visit | Attending: Internal Medicine | Admitting: Internal Medicine

## 2017-05-29 ENCOUNTER — Encounter (HOSPITAL_COMMUNITY): Payer: Self-pay

## 2017-05-29 DIAGNOSIS — Z01818 Encounter for other preprocedural examination: Secondary | ICD-10-CM | POA: Insufficient documentation

## 2017-05-29 DIAGNOSIS — K7460 Unspecified cirrhosis of liver: Secondary | ICD-10-CM | POA: Insufficient documentation

## 2017-05-29 DIAGNOSIS — I8500 Esophageal varices without bleeding: Secondary | ICD-10-CM | POA: Insufficient documentation

## 2017-05-29 LAB — COMPREHENSIVE METABOLIC PANEL
ALBUMIN: 2.5 g/dL — AB (ref 3.5–5.0)
ALT: 23 U/L (ref 14–54)
AST: 33 U/L (ref 15–41)
Alkaline Phosphatase: 136 U/L — ABNORMAL HIGH (ref 38–126)
Anion gap: 6 (ref 5–15)
BUN: 18 mg/dL (ref 6–20)
CHLORIDE: 102 mmol/L (ref 101–111)
CO2: 23 mmol/L (ref 22–32)
Calcium: 9.1 mg/dL (ref 8.9–10.3)
Creatinine, Ser: 1.17 mg/dL — ABNORMAL HIGH (ref 0.44–1.00)
GFR calc Af Amer: 51 mL/min — ABNORMAL LOW (ref >60)
GFR calc non Af Amer: 44 mL/min — ABNORMAL LOW (ref >60)
GLUCOSE: 435 mg/dL — AB (ref 65–99)
POTASSIUM: 4.2 mmol/L (ref 3.5–5.1)
SODIUM: 131 mmol/L — AB (ref 135–145)
Total Bilirubin: 0.9 mg/dL (ref 0.3–1.2)
Total Protein: 6.6 g/dL (ref 6.5–8.1)

## 2017-05-29 LAB — CBC WITH DIFFERENTIAL/PLATELET
BASOS ABS: 0 10*3/uL (ref 0.0–0.1)
Basophils Relative: 1 %
EOS PCT: 18 %
Eosinophils Absolute: 0.6 10*3/uL (ref 0.0–0.7)
HEMATOCRIT: 27.8 % — AB (ref 36.0–46.0)
Hemoglobin: 8.9 g/dL — ABNORMAL LOW (ref 12.0–15.0)
LYMPHS ABS: 0.8 10*3/uL (ref 0.7–4.0)
LYMPHS PCT: 23 %
MCH: 26.1 pg (ref 26.0–34.0)
MCHC: 32 g/dL (ref 30.0–36.0)
MCV: 81.5 fL (ref 78.0–100.0)
Monocytes Absolute: 0.5 10*3/uL (ref 0.1–1.0)
Monocytes Relative: 13 %
NEUTROS ABS: 1.6 10*3/uL — AB (ref 1.7–7.7)
Neutrophils Relative %: 46 %
PLATELETS: 69 10*3/uL — AB (ref 150–400)
RBC: 3.41 MIL/uL — ABNORMAL LOW (ref 3.87–5.11)
RDW: 16.8 % — ABNORMAL HIGH (ref 11.5–15.5)
WBC: 3.6 10*3/uL — AB (ref 4.0–10.5)

## 2017-05-29 LAB — SURGICAL PCR SCREEN
MRSA, PCR: POSITIVE — AB
Staphylococcus aureus: POSITIVE — AB

## 2017-06-03 ENCOUNTER — Ambulatory Visit (HOSPITAL_COMMUNITY)
Admission: RE | Admit: 2017-06-03 | Discharge: 2017-06-03 | Disposition: A | Discharge status: Home / Self Care | Payer: Medicare Other | Source: Ambulatory Visit | Attending: Internal Medicine | Admitting: Internal Medicine

## 2017-06-03 ENCOUNTER — Encounter (HOSPITAL_COMMUNITY): Payer: Self-pay | Admitting: *Deleted

## 2017-06-03 ENCOUNTER — Ambulatory Visit (HOSPITAL_COMMUNITY): Payer: Medicare Other | Admitting: Anesthesiology

## 2017-06-03 ENCOUNTER — Encounter (HOSPITAL_COMMUNITY)
Admission: RE | Disposition: A | Discharge status: Home / Self Care | Payer: Self-pay | Source: Ambulatory Visit | Attending: Internal Medicine

## 2017-06-03 DIAGNOSIS — Z853 Personal history of malignant neoplasm of breast: Secondary | ICD-10-CM | POA: Diagnosis not present

## 2017-06-03 DIAGNOSIS — E119 Type 2 diabetes mellitus without complications: Secondary | ICD-10-CM | POA: Diagnosis not present

## 2017-06-03 DIAGNOSIS — M1990 Unspecified osteoarthritis, unspecified site: Secondary | ICD-10-CM | POA: Diagnosis not present

## 2017-06-03 DIAGNOSIS — K766 Portal hypertension: Secondary | ICD-10-CM | POA: Diagnosis not present

## 2017-06-03 DIAGNOSIS — Z7984 Long term (current) use of oral hypoglycemic drugs: Secondary | ICD-10-CM | POA: Insufficient documentation

## 2017-06-03 DIAGNOSIS — I10 Essential (primary) hypertension: Secondary | ICD-10-CM | POA: Insufficient documentation

## 2017-06-03 DIAGNOSIS — D731 Hypersplenism: Secondary | ICD-10-CM | POA: Insufficient documentation

## 2017-06-03 DIAGNOSIS — K315 Obstruction of duodenum: Secondary | ICD-10-CM

## 2017-06-03 DIAGNOSIS — Z79899 Other long term (current) drug therapy: Secondary | ICD-10-CM | POA: Insufficient documentation

## 2017-06-03 DIAGNOSIS — F0390 Unspecified dementia without behavioral disturbance: Secondary | ICD-10-CM | POA: Insufficient documentation

## 2017-06-03 DIAGNOSIS — R918 Other nonspecific abnormal finding of lung field: Secondary | ICD-10-CM | POA: Insufficient documentation

## 2017-06-03 DIAGNOSIS — K7460 Unspecified cirrhosis of liver: Secondary | ICD-10-CM | POA: Insufficient documentation

## 2017-06-03 DIAGNOSIS — I8510 Secondary esophageal varices without bleeding: Secondary | ICD-10-CM | POA: Diagnosis not present

## 2017-06-03 DIAGNOSIS — I8500 Esophageal varices without bleeding: Secondary | ICD-10-CM | POA: Diagnosis not present

## 2017-06-03 DIAGNOSIS — K219 Gastro-esophageal reflux disease without esophagitis: Secondary | ICD-10-CM | POA: Insufficient documentation

## 2017-06-03 DIAGNOSIS — D696 Thrombocytopenia, unspecified: Secondary | ICD-10-CM | POA: Diagnosis not present

## 2017-06-03 DIAGNOSIS — Z9221 Personal history of antineoplastic chemotherapy: Secondary | ICD-10-CM | POA: Insufficient documentation

## 2017-06-03 DIAGNOSIS — E785 Hyperlipidemia, unspecified: Secondary | ICD-10-CM | POA: Diagnosis not present

## 2017-06-03 DIAGNOSIS — E039 Hypothyroidism, unspecified: Secondary | ICD-10-CM | POA: Diagnosis not present

## 2017-06-03 DIAGNOSIS — Z923 Personal history of irradiation: Secondary | ICD-10-CM | POA: Diagnosis not present

## 2017-06-03 DIAGNOSIS — K3189 Other diseases of stomach and duodenum: Secondary | ICD-10-CM | POA: Diagnosis not present

## 2017-06-03 HISTORY — PX: ESOPHAGOGASTRODUODENOSCOPY (EGD) WITH PROPOFOL: SHX5813

## 2017-06-03 HISTORY — PX: ESOPHAGEAL BANDING: SHX5518

## 2017-06-03 LAB — GLUCOSE, CAPILLARY: Glucose-Capillary: 133 mg/dL — ABNORMAL HIGH (ref 65–99)

## 2017-06-03 SURGERY — ESOPHAGOGASTRODUODENOSCOPY (EGD) WITH PROPOFOL
Anesthesia: Monitor Anesthesia Care

## 2017-06-03 MED ORDER — FENTANYL CITRATE (PF) 100 MCG/2ML IJ SOLN
25.0000 ug | Freq: Once | INTRAMUSCULAR | Status: AC
  Administered 2017-06-03: 25 ug via INTRAVENOUS

## 2017-06-03 MED ORDER — LIDOCAINE VISCOUS 2 % MT SOLN
5.0000 mL | Freq: Once | OROMUCOSAL | Status: AC
  Administered 2017-06-03: 5 mL via OROMUCOSAL

## 2017-06-03 MED ORDER — PROPOFOL 500 MG/50ML IV EMUL
INTRAVENOUS | Status: DC | PRN
  Administered 2017-06-03: 100 ug/kg/min via INTRAVENOUS

## 2017-06-03 MED ORDER — PROPOFOL 10 MG/ML IV BOLUS
INTRAVENOUS | Status: AC
  Filled 2017-06-03: qty 60

## 2017-06-03 MED ORDER — LIDOCAINE VISCOUS 2 % MT SOLN
OROMUCOSAL | Status: AC
  Filled 2017-06-03: qty 15

## 2017-06-03 MED ORDER — FENTANYL CITRATE (PF) 100 MCG/2ML IJ SOLN
INTRAMUSCULAR | Status: AC
  Filled 2017-06-03: qty 2

## 2017-06-03 MED ORDER — PROPOFOL 10 MG/ML IV BOLUS
INTRAVENOUS | Status: DC | PRN
  Administered 2017-06-03 (×2): 20 mg via INTRAVENOUS

## 2017-06-03 MED ORDER — MIDAZOLAM HCL 2 MG/2ML IJ SOLN
INTRAMUSCULAR | Status: AC
  Filled 2017-06-03: qty 2

## 2017-06-03 MED ORDER — MIDAZOLAM HCL 2 MG/2ML IJ SOLN
1.0000 mg | INTRAMUSCULAR | Status: AC
  Administered 2017-06-03 (×2): 1 mg via INTRAVENOUS

## 2017-06-03 MED ORDER — LACTATED RINGERS IV SOLN
INTRAVENOUS | Status: DC
  Administered 2017-06-03: 08:00:00 via INTRAVENOUS

## 2017-06-03 NOTE — Anesthesia Preprocedure Evaluation (Addendum)
Anesthesia Evaluation  Patient identified by MRN, date of birth, ID band Patient awake  General Assessment Comment:Somewhat weak and lethargic, but responds appropriately.  Reviewed: Allergy & Precautions, NPO status , Patient's Chart, lab work & pertinent test results  Airway Mallampati: II  TM Distance: >3 FB     Dental  (+) Partial Lower, Partial Upper   Pulmonary neg pulmonary ROS,  pulm nodules   breath sounds clear to auscultation       Cardiovascular hypertension, Pt. on medications and Pt. 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)  Esophageal Varices    ,   Endo/Other  diabetes, Type 2Hypothyroidism   Renal/GU      Musculoskeletal   Abdominal   Peds  Hematology  (+) anemia ,   Anesthesia Other Findings Breast Cancer   Reproductive/Obstetrics                             Anesthesia Physical Anesthesia Plan  ASA: IV  Anesthesia Plan: MAC   Post-op Pain Management:    Induction: Intravenous  PONV Risk Score and Plan:   Airway Management Planned: Simple Face Mask  Additional Equipment:   Intra-op Plan:   Post-operative Plan:   Informed Consent: I have reviewed the patients History and Physical, chart, labs and discussed the procedure including the risks, benefits and alternatives for the proposed anesthesia with the patient or authorized representative who has indicated his/her understanding and acceptance.     Plan Discussed with:   Anesthesia Plan Comments:        Anesthesia Quick Evaluation

## 2017-06-03 NOTE — H&P (Signed)
@LOGO @   Primary Care Physician:  Neale Burly, MD Primary Gastroenterologist:  Dr. Gala Romney  Pre-Procedure History & Physical: HPI:  Melissa Rose is a 76 y.o. female here for esophageal variceal band ligation. History of EVL in June of this year.  Past Medical History:  Diagnosis Date  . Arthritis   . Breast cancer ([place]) 2000   treated with chemo/radiation  . Dementia    slight  . Diabetes mellitus without complication ([place])   . GERD (gastroesophageal reflux disease)   . Hyperlipidemia   . Hypertension   . Hypothyroidism     Past Surgical History:  Procedure Laterality Date  . BREAST SURGERY Right   . 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 COLONOSCOPY  11/2016   Dr. Ladona Horns: normal  . ESOPHAGEAL BANDING N/A 02/13/2017   Procedure: ESOPHAGEAL BANDING;  Surgeon: Daneil Dolin, MD;  [facility];  Service: Endoscopy;  Laterality: N/A;  . ESOPHAGEAL BANDING N/A 03/25/2017   Procedure: ESOPHAGEAL BANDING;  Surgeon: Daneil Dolin, MD;  Location[facility];  Service: Endoscopy;  Laterality: N/A;  Esophageal varices banding  . ESOPHAGOGASTRODUODENOSCOPY  11/2016   Dr. 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  . ESOPHAGOGASTRODUODENOSCOPY (EGD) WITH PROPOFOL N/A 02/13/2017   Procedure: ESOPHAGOGASTRODUODENOSCOPY (EGD) WITH PROPOFOL;  Surgeon: Daneil Dolin, MD;  [facility];  Service: Endoscopy;  Laterality: N/A;  145   . ESOPHAGOGASTRODUODENOSCOPY (EGD) WITH PROPOFOL N/A 03/25/2017   Procedure: ESOPHAGOGASTRODUODENOSCOPY (EGD) WITH PROPOFOL;  Surgeon: Daneil Dolin, MD;  [facility];  Service: Endoscopy;  Laterality: N/A;  7:30am    Prior to Admission medications   Medication Sig Start Date End Date Taking? Authorizing Provider  atorvastatin (LIPITOR) 10 MG tablet Take  10 mg by mouth at bedtime.  11/02/16  Yes [provider]  Calcium Carb-Cholecalciferol (CALCIUM 600/VITAMIN D3) 600-800 MG-UNIT TABS Take 1 tablet by mouth daily.   Yes [provider]  donepezil (ARICEPT) 10 MG tablet Take 10 mg by mouth every evening.  11/02/16  Yes [provider]  furosemide (LASIX) 20 MG tablet Take 20 mg by mouth daily.   Yes [provider]  glipiZIDE (GLUCOTROL) 5 MG tablet Take 5 mg by mouth 2 (two) times daily. 04/27/14  Yes [provider]  lactulose (CHRONULAC) 10 GM/15ML solution Take 10 g by mouth 2 (two) times daily.   Yes [provider]  levothyroxine (SYNTHROID, LEVOTHROID) 100 MCG tablet Take 100 mcg by mouth daily.  11/02/16  Yes [provider]  lisinopril (PRINIVIL,ZESTRIL) 10 MG tablet Take 10 mg by mouth daily. 04/27/14  Yes [provider]  metFORMIN (GLUCOPHAGE) 1000 MG tablet Take 1,000 mg by mouth 2 (two) times daily.   Yes [provider]  mupirocin nasal ointment (BACTROBAN) 2 % Place 1 application into the nose 2 (two) times daily. Use one-half of tube in each nostril twice daily for five (5) days. After application, press sides of nose together and gently massage.   Yes [provider]  propranolol (INDERAL) 10 MG tablet Take 10 mg by mouth 2 (two) times daily.   Yes [provider]  ranitidine (ZANTAC) 300 MG tablet Take 300 mg by mouth 2 (two) times daily.  11/02/16  Yes [provider]  spironolactone (ALDACTONE) 25 MG tablet Take  12.5 mg by mouth 2 (two) times daily.   Yes [provider]    Allergies as of 04/30/2017  . (No Known Allergies)    Family History  Problem Relation Age of Onset  . Diabetes Mother   . Diabetes Father   . Heart disease Father   . Cerebral palsy Sister   . Liver disease Neg Hx   . Colon cancer Neg Hx     Social History   Social History  . Marital status: Married    Spouse name: N/A  . Number of  children: N/A  . Years of education: N/A   Occupational History  . Not on file.   Social History Main Topics  . Smoking status: Never Smoker  . Smokeless tobacco: Never Used  . Alcohol use No  . Drug use: No  . Sexual activity: Not Currently    Birth control/ protection: None   Other Topics Concern  . Not on file   Social History Narrative  . No narrative on file    Review of Systems: See HPI, otherwise negative ROS  Physical Exam: There were no vitals taken for this visit. General:   Alert,   pleasant and cooperative in NAD Neck:  Supple; no masses or thyromegaly. No significant cervical adenopathy. Lungs:  Clear throughout to auscultation.   No wheezes, crackles, or rhonchi. No acute distress. Heart:  Regular rate and rhythm; no murmurs, clicks, rubs,  or gallops. Abdomen: Non-distended, normal bowel sounds.  Soft and nontender without appreciable mass or hepatosplenomegaly.  Pulses:  Normal pulses noted. Extremities:  Without clubbing or edema.  Impression:  History of cirrhosis. History of esophageal varices status post EVL in  June of this year. Here for a repeat session.  Recommendations:  I have offered the patient EGD with EVL.  The risks, benefits, limitations, alternatives and imponderables have been reviewed with the patient. Potential for esophageal dilation, biopsy, etc. have also been reviewed.  Questions have been answered. All parties agreeable.         Notice: This dictation was prepared with Dragon dictation along with smaller phrase technology. Any transcriptional errors that result from this process are unintentional and may not be corrected upon review.

## 2017-06-03 NOTE — Transfer of Care (Signed)
Immediate Anesthesia Transfer of Care Note  Patient: Melissa Rose  Procedure(s) Performed: Procedure(s) with comments: ESOPHAGOGASTRODUODENOSCOPY (EGD) WITH PROPOFOL (N/A) - 8:30am ESOPHAGEAL BANDING (N/A) - varices  Patient Location: PACU  Anesthesia Type:MAC  Level of Consciousness: sedated and patient cooperative  Airway & Oxygen Therapy: Patient Spontanous Breathing  Post-op Assessment: Report given to RN and Post -op Vital signs reviewed and stable  Post vital signs: Reviewed and stable  Last Vitals:  Vitals:   06/03/17 0820 06/03/17 0832  BP: (!) 115/50 (!) 102/42  Pulse: 65 67  Resp: 17 16  Temp: 36.4 C   SpO2: 100% 100%    Last Pain: There were no vitals filed for this visit.       Complications: No apparent anesthesia complications

## 2017-06-03 NOTE — Discharge Instructions (Addendum)
EGD Discharge instructions Please read the instructions outlined below and refer to this sheet in the next few weeks. These discharge instructions provide you with general information on caring for yourself after you leave the hospital. Your doctor may also give you specific instructions. While your treatment has been planned according to the most current medical practices available, unavoidable complications occasionally occur. If you have any problems or questions after discharge, please call your doctor. ACTIVITY  You may resume your regular activity but move at a slower pace for the next 24 hours.   Take frequent rest periods for the next 24 hours.   Walking will help expel (get rid of) the air and reduce the bloated feeling in your abdomen.   No driving for 24 hours (because of the anesthesia (medicine) used during the test).   You may shower.   Do not sign any important legal documents or operate any machinery for 24 hours (because of the anesthesia used during the test).  NUTRITION  Drink plenty of fluids.   You may resume your normal diet.   Begin with a light meal and progress to your normal diet.   Avoid alcoholic beverages for 24 hours or as instructed by your caregiver.  MEDICATIONS  You may resume your normal medications unless your caregiver tells you otherwise.  WHAT YOU CAN EXPECT TODAY  You may experience abdominal discomfort such as a feeling of fullness or gas pains.  FOLLOW-UP  Your doctor will discuss the results of your test with you.  SEEK IMMEDIATE MEDICAL ATTENTION IF ANY OF THE FOLLOWING OCCUR:  Excessive nausea (feeling sick to your stomach) and/or vomiting.   Severe abdominal pain and distention (swelling).   Trouble swallowing.   Temperature over 101 F (37.8 C).   Rectal bleeding or vomiting of blood.    Soft diet remainder of today  Advance as tolerated tomorrow  Office visit with [place] in 3 months    Soft-Food Meal Plan A  soft-food meal plan includes foods that are safe and easy to swallow. This meal plan typically is used:  If you are having trouble chewing or swallowing foods.  As a transition meal plan after only having had liquid meals for a long period.  What do I need to know about the soft-food meal plan? A soft-food meal plan includes tender foods that are soft and easy to chew and swallow. In most cases, bite-sized pieces of food are easier to swallow. A bite-sized piece is about  inch or smaller. Foods in this plan do not need to be ground or pureed. Foods that are very hard, crunchy, or sticky should be avoided. Also, breads, cereals, yogurts, and desserts with nuts, seeds, or fruits should be avoided. What foods can I eat? Grains Rice and wild rice. Moist bread, dressing, pasta, and noodles. Well-moistened dry or cooked cereals, such as farina (cooked wheat cereal), oatmeal, or grits. Biscuits, breads, muffins, pancakes, and waffles that have been well moistened. Vegetables Shredded lettuce. Cooked, tender vegetables, including potatoes without skins. Vegetable juices. Broths or creamed soups made with vegetables that are not stringy or chewy. Strained tomatoes (without seeds). Fruits Canned or well-cooked fruits. Soft (ripe), peeled fresh fruits, such as peaches, nectarines, kiwi, cantaloupe, honeydew melon, and watermelon (without seeds). Soft berries with small seeds, such as strawberries. Fruit juices (without pulp). Meats and Other Protein Sources Moist, tender, lean beef. Mutton. Lamb. Veal. Chicken. [place]. Liver. Ham. Fish without bones. Eggs. Dairy Milk, milk drinks, and cream. Plain cream  cheese and cottage cheese. Plain yogurt. Sweets/Desserts Flavored gelatin desserts. Custard. Plain ice cream, frozen yogurt, sherbet, milk shakes, and malts. Plain cakes and cookies. Plain hard candy. Other Butter, margarine (without trans fat), and cooking oils. Mayonnaise. Cream sauces. Mild spices,  salt, and sugar. Syrup, molasses, honey, and jelly. The items listed above may not be a complete list of recommended foods or beverages. Contact your dietitian for more options. What foods are not recommended? Grains Dry bread, toast, crackers that have not been moistened. Coarse or dry cereals, such as bran, granola, and shredded wheat. Tough or chewy crusty breads, such as [place] bread or baguettes. Vegetables Corn. Raw vegetables except shredded lettuce. Cooked vegetables that are tough or stringy. Tough, crisp, fried potatoes and potato skins. Fruits Fresh fruits with skins or seeds or both, such as apples, pears, or grapes. Stringy, high-pulp fruits, such as papaya, pineapple, coconut, or mango. Fruit leather, fruit roll-ups, and all dried fruits. Meats and Other Protein Sources Sausages and hot dogs. Meats with gristle. Fish with bones. Nuts, seeds, and chunky peanut or other nut butters. Sweets/Desserts Cakes or cookies that are very dry or chewy. The items listed above may not be a complete list of foods and beverages to avoid. Contact your dietitian for more information. This information is not intended to replace advice given to you by your health care provider. Make sure you discuss any questions you have with your health care provider. Document Released: 12/18/2007 Document Revised: 02/16/2016 Document Reviewed: 08/07/2013 Elsevier Interactive Patient Education  2017 Elsevier Inc[employer]   PATIENT INSTRUCTIONS POST-ANESTHESIA  IMMEDIATELY FOLLOWING SURGERY:  Do not drive or operate machinery for the first twenty four hours after surgery.  Do not make any important decisions for twenty four hours after surgery or while taking narcotic pain medications or sedatives.  If you develop intractable nausea and vomiting or a severe headache please notify your doctor immediately.  FOLLOW-UP:  Please make an appointment with your surgeon as instructed. You do not need to follow up with anesthesia  unless specifically instructed to do so.  WOUND CARE INSTRUCTIONS (if applicable):  Keep a dry clean dressing on the anesthesia/puncture wound site if there is drainage.  Once the wound has quit draining you may leave it open to air.  Generally you should leave the bandage intact for twenty four hours unless there is drainage.  If the epidural site drains for more than 36-48 hours please call the anesthesia department.  QUESTIONS?:  Please feel free to call your physician or the hospital operator if you have any questions, and they will be happy to assist you.

## 2017-06-03 NOTE — H&P (Signed)
error 

## 2017-06-03 NOTE — Op Note (Signed)
[facility] Patient Name: Melissa Rose Procedure Date: 06/03/2017 8:16 AM MRN: [id number] Date of Birth: 05-16-1941 Attending MD: Norvel Richards , MD CSN: [id number] Age: 76 Admit Type: Outpatient Procedure:                Upper GI endoscopy Indications:              Surveillance procedure Providers:                Norvel Richards, MD, Janeece Riggers, RN, Rosina Lowenstein, RN Referring MD:              Medicines:                Propofol per Anesthesia Complications:            No immediate complications. Estimated Blood Loss:     Estimated blood loss was minimal. Procedure:                Pre-Anesthesia Assessment:                           - Prior to the procedure, a History and Physical                            was performed, and patient medications and                            allergies were reviewed. The patient's tolerance of                            previous anesthesia was also reviewed. The risks                            and benefits of the procedure and the sedation                            options and risks were discussed with the patient.                            All questions were answered, and informed consent                            was obtained. Prior Anticoagulants: The patient has                            taken no previous anticoagulant or antiplatelet                            agents. ASA Grade Assessment: III - A patient with                            severe systemic disease. After reviewing the risks  and benefits, the patient was deemed in                            satisfactory condition to undergo the procedure.                           After obtaining informed consent, the endoscope was                            passed under direct vision. Throughout the                            procedure, the patient's blood pressure, pulse, and                            oxygen saturations  were monitored continuously. The                            EG-[id number] ([id number]) scope was introduced through the                            and advanced to the second part of duodenum. The                            upper GI endoscopy was accomplished without                            difficulty. The patient tolerated the procedure                            well. Scope In: 8:42:22 AM Scope Out: 8:51:33 AM Total Procedure Duration: 0 hours 9 minutes 11 seconds  Findings:      Varices were found in the esophagus. 1 column grade 3; 3 columns grade 2;      Portal hypertensive gastropathy was found in the stomach.      An acquired stenosis was found in the first portion of the duodenum.       Scope easily traversed the stenosis between the first and second portion       of duodenum. utilizing the microvasiveive 7 shot bander, Six bands were       successfully placed with near eradication of varices. Estimated blood       loss was minimal. Impression:               - Esophageal varices. Banded.                           - Portal hypertensive gastropathy.                           - Acquired duodenal stenosis.                           - No specimens collected. Moderate Sedation:      Moderate (conscious) sedation was personally administered by an       anesthesia professional. The following parameters were  monitored: oxygen       saturation, heart rate, blood pressure, respiratory rate, EKG, adequacy       of pulmonary ventilation, and response to care. Total physician       intraservice time was 12 minutes. Recommendation:           - Patient has a contact number available for                            emergencies. The signs and symptoms of potential                            delayed complications were discussed with the                            patient. Return to normal activities tomorrow.                            Written discharge instructions were provided to the                             patient.                           - Resume previous diet.                           - Continue present medications.                           - Await pathology results.                           - Repeat upper endoscopy (date not yet determined)                            for surveillance.                           - Return to GI clinic in 3 months. Procedure Code(s):        --- Professional ---                           [phone], Esophagogastroduodenoscopy, flexible,                            transoral; with band ligation of esophageal/gastric                            varices Diagnosis Code(s):        --- Professional ---                           I85.00, Esophageal varices without bleeding                           K76.6, Portal hypertension  K31.89, Other diseases of stomach and duodenum                           K31.5, Obstruction of duodenum CPT copyright 2016 American Medical Association. All rights reserved. The codes documented in this report are preliminary and upon coder review may  be revised to meet current compliance requirements. [name]. [name], MD Norvel Richards, MD 06/03/2017 9:03:48 AM This report has been signed electronically. Number of Addenda: 0

## 2017-06-03 NOTE — Anesthesia Postprocedure Evaluation (Signed)
Anesthesia Post Note  Patient: Melissa Rose  Procedure(s) Performed: Procedure(s) (LRB): ESOPHAGOGASTRODUODENOSCOPY (EGD) WITH PROPOFOL (N/A) ESOPHAGEAL BANDING (N/A)  Patient location during evaluation: PACU Anesthesia Type: MAC Level of consciousness: awake and alert Pain management: satisfactory to patient Vital Signs Assessment: post-procedure vital signs reviewed and stable Respiratory status: spontaneous breathing Cardiovascular status: stable Postop Assessment: no signs of nausea or vomiting Anesthetic complications: no     Last Vitals:  Vitals:   06/03/17 0832 06/03/17 0900  BP: (!) 102/42 (!) 145/63  Pulse: 67 73  Resp: 16 (!) 21  Temp:  36.5 C  SpO2: 100% 99%    Last Pain:  Vitals:   06/03/17 0900  PainSc: Asleep                 Ettel Albergo

## 2017-06-03 NOTE — OR Nursing (Signed)
Patient right arm swollen prior to procedure

## 2017-06-03 NOTE — Anesthesia Procedure Notes (Signed)
Procedure Name: MAC Date/Time: 06/03/2017 8:35 AM Performed by: Vista Deck Pre-anesthesia Checklist: Patient identified, Emergency Drugs available, Suction available, Timeout performed and Patient being monitored Patient Re-evaluated:Patient Re-evaluated prior to induction Oxygen Delivery Method: Non-rebreather mask

## 2017-06-05 ENCOUNTER — Encounter (HOSPITAL_COMMUNITY): Payer: Self-pay | Admitting: Internal Medicine

## 2017-06-18 ENCOUNTER — Other Ambulatory Visit: Payer: Self-pay

## 2017-06-18 DIAGNOSIS — D509 Iron deficiency anemia, unspecified: Secondary | ICD-10-CM

## 2017-08-01 DIAGNOSIS — I10 Essential (primary) hypertension: Secondary | ICD-10-CM | POA: Diagnosis not present

## 2017-08-01 DIAGNOSIS — E7849 Other hyperlipidemia: Secondary | ICD-10-CM | POA: Diagnosis not present

## 2017-08-01 DIAGNOSIS — E08610 Diabetes mellitus due to underlying condition with diabetic neuropathic arthropathy: Secondary | ICD-10-CM | POA: Diagnosis not present

## 2017-08-07 DIAGNOSIS — E08610 Diabetes mellitus due to underlying condition with diabetic neuropathic arthropathy: Secondary | ICD-10-CM | POA: Diagnosis not present

## 2017-08-07 DIAGNOSIS — I10 Essential (primary) hypertension: Secondary | ICD-10-CM | POA: Diagnosis not present

## 2017-08-07 DIAGNOSIS — E7849 Other hyperlipidemia: Secondary | ICD-10-CM | POA: Diagnosis not present

## 2017-08-09 ENCOUNTER — Telehealth: Payer: Self-pay | Admitting: Internal Medicine

## 2017-08-09 ENCOUNTER — Encounter: Payer: Self-pay | Admitting: *Deleted

## 2017-08-09 NOTE — Telephone Encounter (Signed)
Recall for ultrasound 

## 2017-08-09 NOTE — Telephone Encounter (Signed)
Letter mailed to pt.  

## 2017-08-28 ENCOUNTER — Other Ambulatory Visit: Payer: Self-pay | Admitting: *Deleted

## 2017-08-28 ENCOUNTER — Encounter: Payer: Self-pay | Admitting: *Deleted

## 2017-08-28 ENCOUNTER — Ambulatory Visit: Payer: Medicare Other | Admitting: Nurse Practitioner

## 2017-08-28 ENCOUNTER — Encounter: Payer: Self-pay | Admitting: Nurse Practitioner

## 2017-08-28 VITALS — BP 146/60 | HR 65 | Temp 96.7°F | Ht 64.0 in | Wt 168.0 lb

## 2017-08-28 DIAGNOSIS — D6959 Other secondary thrombocytopenia: Secondary | ICD-10-CM | POA: Diagnosis not present

## 2017-08-28 DIAGNOSIS — K7460 Unspecified cirrhosis of liver: Secondary | ICD-10-CM | POA: Diagnosis not present

## 2017-08-28 DIAGNOSIS — D731 Hypersplenism: Secondary | ICD-10-CM

## 2017-08-28 DIAGNOSIS — I8500 Esophageal varices without bleeding: Secondary | ICD-10-CM

## 2017-08-28 MED ORDER — PROPRANOLOL HCL 10 MG PO TABS: 10.0000 mg | ORAL_TABLET | Freq: Two times a day (BID) | ORAL | 5 refills | Status: DC

## 2017-08-28 NOTE — Assessment & Plan Note (Signed)
Noted thrombocytopenia with splenomegaly and evidence of portal hypertension.  Her platelet count is essentially stable.  Not in a dangerous level at this time.  I will recheck platelet level and I do her CBC for routine liver labs.  Return for follow-up in 3 months.  Possible increased bleeding risk due to thrombocytopenia.

## 2017-08-28 NOTE — Assessment & Plan Note (Signed)
Noted chronic cirrhosis with esophageal varices.  She does have evidence of portal hypertension on imaging.  Her last imaging was in April 2018.  Her last labs were not fully completed.  I will order labs and right upper quadrant ultrasound for routine liver care.  Including CBC, CMP, INR, AFP.  Right upper quadrant ultrasound.  Return for follow-up in 3 months.

## 2017-08-28 NOTE — Patient Instructions (Signed)
1. It is important that you have your labs done when you are able to. 2. We will help you schedule your liver ultrasound. 3. I have restarted propranolol.  Take per instructions on the bottle. 4. We will help schedule your upper endoscopy and band placement with Dr. work. 5. Return for follow-up in 3 months. 6. Call if you have any questions or concerns.

## 2017-08-28 NOTE — Assessment & Plan Note (Signed)
The patient has persistent esophageal varices.  She is undergone esophageal variceal ligation 3 times this year.  Last EGD did not recommend a timeframe.  She has been going about every 1-2 months.  Her last EGD found 1 column of grade 3 varices, 3 columns of grade 2 varices, portal hypertensive gastropathy.  At this point we will set her up for another EGD with PVL on propofol.  At some point she stopped taking propranolol, they are not sure how or why.  I will reorder her propranolol and recommend she take this.  Her heart rate today was 65.  Goal on nonselective beta-blocker for variceal prophylaxis is heart rate of 55-60.  Return for follow-up in 3 months.  Proceed with EGD with Dr. Gala Romney in near future: the risks, benefits, and alternatives have been discussed with the patient in detail. The patient states understanding and desires to proceed.  The patient is not on any anticoagulants, anxiolytics, chronic pain medications, or antidepressants.  She does seem a bit anxious.  Her previous procedures were done on propofol we will plan for propofol with monitored anesthesia care for decreased risk burden and to promote adequate sedation.

## 2017-08-28 NOTE — Progress Notes (Signed)
Referring Provider: Neale Burly, MD Primary Care Physician:  Neale Burly, MD Primary GI:  Dr. Gala Romney  Chief Complaint  Patient presents with  . Cirrhosis  . Nausea    HPI:   Melissa Rose is a 76 y.o. female who presents for follow-up on esophageal varices.  The patient was last seen in our office 04/30/2017 for cirrhosis and esophageal varices.  He was status post esophageal varicocele ligation in June 2018.  He presented to set up a follow-up session.  She is doing well, noted to be immune to hepatitis A but susceptible to hepatitis B.  Uses lactulose daily with 1-3 semi-formed bowel movements daily.  Recommended repeat EGD with band ligation on propofol.  Needs vaccination against hepatitis B, continue lactulose.  EGD was completed on 06/03/2017 which found a single column of grade 3 varices, 3 columns of grade 2 varices, portal hypertensive gastropathy in the stomach.  Acquired stenosis found in the first part of the duodenum which is easily traversed by the scope.  Varices were banded.  Recommended continue current medications, await pathology results, repeat upper endoscopy for surveillance and possible further ligation with bands.  Last abdominal imaging was completed 01/11/2017 at the outside facility.  This was an ultrasound of the abdomen and found likely single gallstone, lobular hepatic contour suggesting cirrhosis, portal vein patent with normal directional flow, splenomegaly measuring 13.2 cm and ascites of and on described amount.  Currently due for repeat abdominal imaging.  Last liver labs completed 05/06/2017 and 05/29/2017.  CBC noted anemia with a hemoglobin of 8.9, depressed platelet count of 69 consistent with portal hypertension and splenomegaly.  CMP found sodium depressed at 131, creatinine elevated 1.17 no improved over baseline, normal AST/ALT, elevated alkaline phosphatase of 136, normal bilirubin at 0.9.  INR was not completed and therefore unable to calculate  meld score.  Appears to be mildly decompensated.  Both the patient and her husband are poor historians. Today she states she's doing well overall. Denies abdominal pain, N/V, hematochezia, melena, fever, chills. States she has lost about 40 pounds subjectively over 7 months. Changes her eating habits and eats less. Objectively she was 181 in April and 162 in August, today she is 168. Has early satiety. Denies yellowing of skin/eyes, darkened urine, acute episodic confusion, tremors. Still taking Lactulose and having a bowel movement 2 or more times a day. Not on Propranolol currently, they're note sure why she stopped taking it; thinks maybe they ran out. Denies LE edema and abdominal swelling.  HR today off propranolol is 65 bpm; goal 55-60.  Past Medical History:  Diagnosis Date  . Arthritis   . Breast cancer ([place]) 2000   treated with chemo/radiation  . Dementia    slight  . Diabetes mellitus without complication ([place])   . GERD (gastroesophageal reflux disease)   . Hyperlipidemia   . Hypertension   . Hypothyroidism     Past Surgical History:  Procedure Laterality Date  . BREAST SURGERY Right   . 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 COLONOSCOPY  11/2016   Dr. Ladona Horns: normal  . ESOPHAGEAL BANDING N/A 02/13/2017   Procedure: ESOPHAGEAL BANDING;  Surgeon: Daneil Dolin, MD;  [facility];  Service: Endoscopy;  Laterality: N/A;  . ESOPHAGEAL BANDING N/A 03/25/2017   Procedure: ESOPHAGEAL BANDING;  Surgeon: Daneil Dolin, MD;  [facility];  Service: Endoscopy;  Laterality:  N/A;  Esophageal varices banding  . ESOPHAGEAL BANDING N/A 06/03/2017   Procedure: ESOPHAGEAL BANDING;  Surgeon: Daneil Dolin, MD;  [facility];  Service: Endoscopy;  Laterality: N/A;  varices  . ESOPHAGOGASTRODUODENOSCOPY  11/2016   Dr. Ladona Horns: Short stricture in the first part of the duodenum, not traversable.,  There were varices in the distal esophagus which were not bleeding.  . ESOPHAGOGASTRODUODENOSCOPY (EGD) WITH PROPOFOL N/A 02/13/2017   Procedure: ESOPHAGOGASTRODUODENOSCOPY (EGD) WITH PROPOFOL;  Surgeon: Daneil Dolin, MD;  [facility];  Service: Endoscopy;  Laterality: N/A;  145   . ESOPHAGOGASTRODUODENOSCOPY (EGD) WITH PROPOFOL N/A 03/25/2017   Procedure: ESOPHAGOGASTRODUODENOSCOPY (EGD) WITH PROPOFOL;  Surgeon: Daneil Dolin, MD;  [facility];  Service: Endoscopy;  Laterality: N/A;  7:30am  . ESOPHAGOGASTRODUODENOSCOPY (EGD) WITH PROPOFOL N/A 06/03/2017   Procedure: ESOPHAGOGASTRODUODENOSCOPY (EGD) WITH PROPOFOL;  Surgeon: Daneil Dolin, MD;  [facility];  Service: Endoscopy;  Laterality: N/A;  8:30am    Current Outpatient Medications  Medication Sig Dispense Refill  . atorvastatin (LIPITOR) 10 MG tablet Take 10 mg by mouth at bedtime.     . Calcium Carb-Cholecalciferol (CALCIUM 600/VITAMIN D3) 600-800 MG-UNIT TABS Take 1 tablet by mouth daily.    Marland Kitchen donepezil (ARICEPT) 10 MG tablet Take 10 mg by mouth every evening.     . furosemide (LASIX) 20 MG tablet Take 20 mg by mouth daily.    Marland Kitchen glipiZIDE (GLUCOTROL) 5 MG tablet Take 5 mg by mouth 2 (two) times daily.    . Insulin Aspart (NOVOLOG FLEXPEN Junior) Inject into the skin 2 (two) times daily. 40 units in am and 45 units in the evening    . lactulose (CHRONULAC) 10 GM/15ML solution Take 10 g by mouth 2 (two) times daily.    Marland Kitchen levothyroxine (SYNTHROID, LEVOTHROID) 100 MCG tablet Take 100 mcg by mouth daily.     Marland Kitchen lisinopril (PRINIVIL,ZESTRIL) 10 MG tablet Take 10 mg by mouth daily.    . metFORMIN (GLUCOPHAGE) 1000 MG tablet Take 1,000 mg by mouth 2 (two) times daily.    . mupirocin nasal ointment (BACTROBAN) 2 % Place 1 application into the nose 2 (two) times daily. Use one-half of tube in each nostril twice daily for five (5) days. After application, press sides of nose together and gently massage.    .  ranitidine (ZANTAC) 300 MG tablet Take 300 mg by mouth 2 (two) times daily.     . propranolol (INDERAL) 10 MG tablet Take 10 mg by mouth 2 (two) times daily.    Marland Kitchen spironolactone (ALDACTONE) 25 MG tablet Take 12.5 mg by mouth 2 (two) times daily.     No current facility-administered medications for this visit.     Allergies as of 08/28/2017  . (No Known Allergies)    Family History  Problem Relation Age of Onset  . Diabetes Mother   . Diabetes Father   . Heart disease Father   . Cerebral palsy Sister   . Liver disease Neg Hx   . Colon cancer Neg Hx     Social History   Socioeconomic History  . Marital status: Married    Spouse name: None  . Number of children: None  . Years of education: None  . Highest education level: None  Social Needs  . Financial resource strain: None  . Food insecurity - worry: None  . Food insecurity - inability: None  . Transportation needs - medical: None  . Transportation  needs - non-medical: None  Occupational History  . None  Tobacco Use  . Smoking status: Never Smoker  . Smokeless tobacco: Never Used  Substance and Sexual Activity  . Alcohol use: No  . Drug use: No  . Sexual activity: Not Currently    Birth control/protection: None  Other Topics Concern  . None  Social History Narrative  . None    Review of Systems: General: Negative for anorexia, weight loss, fever, chills, fatigue, weakness. ENT: Negative for hoarseness, difficulty swallowing. CV: Negative for chest pain, angina, palpitations, peripheral edema.  Respiratory: Negative for dyspnea at rest, cough, sputum, wheezing.  GI: See history of present illness. MS: Negative for joint pain, low back pain. Admits difficulty ambulating. Derm: Negative for rash or itching.  Neuro: Negative for memory loss, confusion.  Endo: Negative for unusual weight change.  Heme: Negative for bruising or bleeding. Allergy: Negative for rash or hives.   Physical Exam: BP (!) 146/60    Pulse 65   Temp (!) 96.7 F (35.9 C) (Oral)   Ht 5\' 4"  (1.626 m)   Wt 168 lb (76.2 kg)   BMI 28.84 kg/m  General:   Alert and oriented. Pleasant and cooperative. Well-nourished and well-developed.  Eyes:  Without icterus, sclera clear and conjunctiva pink.  Ears:  Normal auditory acuity. Cardiovascular:  S1, S2 present with 3/6 blowing systolic murmur appreciated. Normal bilateral DP pulses noted. Extremities without clubbing or edema. Respiratory:  Clear to auscultation bilaterally. No wheezes, rales, or rhonchi. No distress.  Gastrointestinal:  +BS, soft, non-tender and non-distended. No obvious ascites. No HSM noted. No guarding or rebound. No masses appreciated.  Rectal:  Deferred  Musculoskalatal:  Symmetrical without gross deformities.  Neurologic:  Alert and oriented x4;  grossly normal neurologically. Psych:  Alert and cooperative. Normal mood and affect. Heme/Lymph/Immune: No excessive bruising noted.    08/28/2017 4:19 PM   Disclaimer: This note was dictated with voice recognition software. Similar sounding words can inadvertently be transcribed and may not be corrected upon review.

## 2017-08-29 ENCOUNTER — Encounter: Payer: Self-pay | Admitting: Internal Medicine

## 2017-08-29 ENCOUNTER — Encounter: Payer: Self-pay | Admitting: *Deleted

## 2017-08-29 ENCOUNTER — Telehealth: Payer: Self-pay | Admitting: *Deleted

## 2017-08-29 NOTE — Telephone Encounter (Signed)
Called spoke with pt and is aware pre-op is scheduled for 10/03/17 at 12:45pm. Letter mailed.

## 2017-08-29 NOTE — Progress Notes (Signed)
cc'ed to pcp °

## 2017-09-02 ENCOUNTER — Ambulatory Visit: Payer: Medicare Other | Admitting: Gastroenterology

## 2017-09-05 ENCOUNTER — Ambulatory Visit (HOSPITAL_COMMUNITY): Payer: Medicare Other

## 2017-09-05 ENCOUNTER — Ambulatory Visit (HOSPITAL_COMMUNITY): Admission: RE | Admit: 2017-09-05 | Payer: Medicare Other | Source: Ambulatory Visit

## 2017-09-30 NOTE — Patient Instructions (Signed)
Melissa Rose  09/30/2017     @PREFPERIOPPHARMACY @   Your procedure is scheduled on  10/10/2017   Report to [facility] at  830   A.M.  Call this number if you have problems the morning of surgery:  [phone]   Remember:  Do not eat food or drink liquids after midnight.  Take these medicines the morning of surgery with A SIP OF WATER  Levothyroxine, lisinoopril, propranolol, zantac.   Do not wear jewelry, make-up or nail polish.  Do not wear lotions, powders, or perfumes, or deodorant.  Do not shave 48 hours prior to surgery.  Men may shave face and neck.  Do not bring valuables to the hospital.  [facility] is not responsible for any belongings or valuables.  Contacts, dentures or bridgework may not be worn into surgery.  Leave your suitcase in the car.  After surgery it may be brought to your room.  For patients admitted to the hospital, discharge time will be determined by your treatment team.  Patients discharged the day of surgery will not be allowed to drive home.   Name and phone number of your driver:   family Special instructions:  Follow the diet instructions given to you by Dr Roseanne Kaufman office.  Please read over the following fact sheets that you were given. Anesthesia Post-op Instructions and Care and Recovery After Surgery       Esophagogastroduodenoscopy Esophagogastroduodenoscopy (EGD) is a procedure to examine the lining of the esophagus, stomach, and first part of the small intestine (duodenum). This procedure is done to check for problems such as inflammation, bleeding, ulcers, or growths. During this procedure, a long, flexible, lighted tube with a camera attached (endoscope) is inserted down the throat. Tell a health care provider about:  Any allergies you have.  All medicines you are taking, including vitamins, herbs, eye drops, creams, and over-the-counter medicines.  Any problems you or family members have had with anesthetic  medicines.  Any blood disorders you have.  Any surgeries you have had.  Any medical conditions you have.  Whether you are pregnant or may be pregnant. What are the risks? Generally, this is a safe procedure. However, problems may occur, including:  Infection.  Bleeding.  A tear (perforation) in the esophagus, stomach, or duodenum.  Trouble breathing.  Excessive sweating.  Spasms of the larynx.  A slowed heartbeat.  Low blood pressure.  What happens before the procedure?  Follow instructions from your health care provider about eating or drinking restrictions.  Ask your health care provider about: ? Changing or stopping your regular medicines. This is especially important if you are taking diabetes medicines or blood thinners. ? Taking medicines such as aspirin and ibuprofen. These medicines can thin your blood. Do not take these medicines before your procedure if your health care provider instructs you not to.  Plan to have someone take you home after the procedure.  If you wear dentures, be ready to remove them before the procedure. What happens during the procedure?  To reduce your risk of infection, your health care team will wash or sanitize their hands.  An IV tube will be put in a vein in your hand or arm. You will get medicines and fluids through this tube.  You will be given one or more of the following: ? A medicine to help you relax (sedative). ? A medicine to numb the area (local anesthetic). This medicine may be sprayed  into your throat. It will make you feel more comfortable and keep you from gagging or coughing during the procedure. ? A medicine for pain.  A mouth guard may be placed in your mouth to protect your teeth and to keep you from biting on the endoscope.  You will be asked to lie on your left side.  The endoscope will be lowered down your throat into your esophagus, stomach, and duodenum.  Air will be put into the endoscope. This will  help your health care provider see better.  The lining of your esophagus, stomach, and duodenum will be examined.  Your health care provider may: ? Take a tissue sample so it can be looked at in a lab (biopsy). ? Remove growths. ? Remove objects (foreign bodies) that are stuck. ? Treat any bleeding with medicines or other devices that stop tissue from bleeding. ? Widen (dilate) or stretch narrowed areas of your esophagus and stomach.  The endoscope will be taken out. The procedure may vary among health care providers and hospitals. What happens after the procedure?  Your blood pressure, heart rate, breathing rate, and blood oxygen level will be monitored often until the medicines you were given have worn off.  Do not eat or drink anything until the numbing medicine has worn off and your gag reflex has returned. This information is not intended to replace advice given to you by your health care provider. Make sure you discuss any questions you have with your health care provider. Document Released: 01/11/2005 Document Revised: 02/16/2016 Document Reviewed: 08/04/2015 Elsevier Interactive Patient Education  2018 [employer]. Esophagogastroduodenoscopy, Care After Refer to this sheet in the next few weeks. These instructions provide you with information about caring for yourself after your procedure. Your health care provider may also give you more specific instructions. Your treatment has been planned according to current medical practices, but problems sometimes occur. Call your health care provider if you have any problems or questions after your procedure. What can I expect after the procedure? After the procedure, it is common to have:  A sore throat.  Nausea.  Bloating.  Dizziness.  Fatigue.  Follow these instructions at home:  Do not eat or drink anything until the numbing medicine (local anesthetic) has worn off and your gag reflex has returned. You will know that the  local anesthetic has worn off when you can swallow comfortably.  Do not drive for 24 hours if you received a medicine to help you relax (sedative).  If your health care provider took a tissue sample for testing during the procedure, make sure to get your test results. This is your responsibility. Ask your health care provider or the department performing the test when your results will be ready.  Keep all follow-up visits as told by your health care provider. This is important. Contact a health care provider if:  You cannot stop coughing.  You are not urinating.  You are urinating less than usual. Get help right away if:  You have trouble swallowing.  You cannot eat or drink.  You have throat or chest pain that gets worse.  You are dizzy or light-headed.  You faint.  You have nausea or vomiting.  You have chills.  You have a fever.  You have severe abdominal pain.  You have black, tarry, or bloody stools. This information is not intended to replace advice given to you by your health care provider. Make sure you discuss any questions you have with your health  care provider. Document Released: 08/27/2012 Document Revised: 02/16/2016 Document Reviewed: 08/04/2015 Elsevier Interactive Patient Education  2018 [place] Anesthesia is a term that refers to techniques, procedures, and medicines that help a person stay safe and comfortable during a medical procedure. Monitored anesthesia care, or sedation, is one type of anesthesia. Your anesthesia specialist may recommend sedation if you will be having a procedure that does not require you to be unconscious, such as:  Cataract surgery.  A dental procedure.  A biopsy.  A colonoscopy.  During the procedure, you may receive a medicine to help you relax (sedative). There are three levels of sedation:  Mild sedation. At this level, you may feel awake and relaxed. You will be able to follow  directions.  Moderate sedation. At this level, you will be sleepy. You may not remember the procedure.  Deep sedation. At this level, you will be asleep. You will not remember the procedure.  The more medicine you are given, the deeper your level of sedation will be. Depending on how you respond to the procedure, the anesthesia specialist may change your level of sedation or the type of anesthesia to fit your needs. An anesthesia specialist will monitor you closely during the procedure. Let your health care provider know about:  Any allergies you have.  All medicines you are taking, including vitamins, herbs, eye drops, creams, and over-the-counter medicines.  Any use of steroids (by mouth or as a cream).  Any problems you or family members have had with sedatives and anesthetic medicines.  Any blood disorders you have.  Any surgeries you have had.  Any medical conditions you have, such as sleep apnea.  Whether you are pregnant or may be pregnant.  Any use of cigarettes, alcohol, or street drugs. What are the risks? Generally, this is a safe procedure. However, problems may occur, including:  Getting too much medicine (oversedation).  Nausea.  Allergic reaction to medicines.  Trouble breathing. If this happens, a breathing tube may be used to help with breathing. It will be removed when you are awake and breathing on your own.  Heart trouble.  Lung trouble.  Before the procedure Staying hydrated Follow instructions from your health care provider about hydration, which may include:  Up to 2 hours before the procedure - you may continue to drink clear liquids, such as water, clear fruit juice, black coffee, and plain tea.  Eating and drinking restrictions Follow instructions from your health care provider about eating and drinking, which may include:  8 hours before the procedure - stop eating heavy meals or foods such as meat, fried foods, or fatty foods.  6 hours  before the procedure - stop eating light meals or foods, such as toast or cereal.  6 hours before the procedure - stop drinking milk or drinks that contain milk.  2 hours before the procedure - stop drinking clear liquids.  Medicines Ask your health care provider about:  Changing or stopping your regular medicines. This is especially important if you are taking diabetes medicines or blood thinners.  Taking medicines such as aspirin and ibuprofen. These medicines can thin your blood. Do not take these medicines before your procedure if your health care provider instructs you not to.  Tests and exams  You will have a physical exam.  You may have blood tests done to show: ? How well your kidneys and liver are working. ? How well your blood can clot.  General instructions  Plan  to have someone take you home from the hospital or clinic.  If you will be going home right after the procedure, plan to have someone with you for 24 hours.  What happens during the procedure?  Your blood pressure, heart rate, breathing, level of pain and overall condition will be monitored.  An IV tube will be inserted into one of your veins.  Your anesthesia specialist will give you medicines as needed to keep you comfortable during the procedure. This may mean changing the level of sedation.  The procedure will be performed. After the procedure  Your blood pressure, heart rate, breathing rate, and blood oxygen level will be monitored until the medicines you were given have worn off.  Do not drive for 24 hours if you received a sedative.  You may: ? Feel sleepy, clumsy, or nauseous. ? Feel forgetful about what happened after the procedure. ? Have a sore throat if you had a breathing tube during the procedure. ? Vomit. This information is not intended to replace advice given to you by your health care provider. Make sure you discuss any questions you have with your health care provider. Document  Released: 06/06/2005 Document Revised: 02/17/2016 Document Reviewed: 01/01/2016 Elsevier Interactive Patient Education  2018 [place], Care After These instructions provide you with information about caring for yourself after your procedure. Your health care provider may also give you more specific instructions. Your treatment has been planned according to current medical practices, but problems sometimes occur. Call your health care provider if you have any problems or questions after your procedure. What can I expect after the procedure? After your procedure, it is common to:  Feel sleepy for several hours.  Feel clumsy and have poor balance for several hours.  Feel forgetful about what happened after the procedure.  Have poor judgment for several hours.  Feel nauseous or vomit.  Have a sore throat if you had a breathing tube during the procedure.  Follow these instructions at home: For at least 24 hours after the procedure:   Do not: ? Participate in activities in which you could fall or become injured. ? Drive. ? Use heavy machinery. ? Drink alcohol. ? Take sleeping pills or medicines that cause drowsiness. ? Make important decisions or sign legal documents. ? Take care of children on your own.  Rest. Eating and drinking  Follow the diet that is recommended by your health care provider.  If you vomit, drink water, juice, or soup when you can drink without vomiting.  Make sure you have little or no nausea before eating solid foods. General instructions  Have a responsible adult stay with you until you are awake and alert.  Take over-the-counter and prescription medicines only as told by your health care provider.  If you smoke, do not smoke without supervision.  Keep all follow-up visits as told by your health care provider. This is important. Contact a health care provider if:  You keep feeling nauseous or you keep  vomiting.  You feel light-headed.  You develop a rash.  You have a fever. Get help right away if:  You have trouble breathing. This information is not intended to replace advice given to you by your health care provider. Make sure you discuss any questions you have with your health care provider. Document Released: 01/01/2016 Document Revised: 05/02/2016 Document Reviewed: 01/01/2016 Elsevier Interactive Patient Education  [employer].

## 2017-10-02 DIAGNOSIS — M25561 Pain in right knee: Secondary | ICD-10-CM | POA: Diagnosis not present

## 2017-10-02 DIAGNOSIS — M1711 Unilateral primary osteoarthritis, right knee: Secondary | ICD-10-CM | POA: Diagnosis not present

## 2017-10-02 DIAGNOSIS — Z853 Personal history of malignant neoplasm of breast: Secondary | ICD-10-CM | POA: Diagnosis not present

## 2017-10-02 DIAGNOSIS — S8001XA Contusion of right knee, initial encounter: Secondary | ICD-10-CM | POA: Diagnosis not present

## 2017-10-02 DIAGNOSIS — I10 Essential (primary) hypertension: Secondary | ICD-10-CM | POA: Diagnosis not present

## 2017-10-02 DIAGNOSIS — K219 Gastro-esophageal reflux disease without esophagitis: Secondary | ICD-10-CM | POA: Diagnosis not present

## 2017-10-02 DIAGNOSIS — M25461 Effusion, right knee: Secondary | ICD-10-CM | POA: Diagnosis not present

## 2017-10-02 DIAGNOSIS — E119 Type 2 diabetes mellitus without complications: Secondary | ICD-10-CM | POA: Diagnosis not present

## 2017-10-03 ENCOUNTER — Encounter (HOSPITAL_COMMUNITY)
Admission: RE | Admit: 2017-10-03 | Discharge: 2017-10-03 | Disposition: A | Discharge status: Home / Self Care | Payer: Medicare Other | Source: Ambulatory Visit | Attending: Internal Medicine | Admitting: Internal Medicine

## 2017-10-03 ENCOUNTER — Other Ambulatory Visit: Payer: Self-pay

## 2017-10-03 ENCOUNTER — Encounter (HOSPITAL_COMMUNITY): Payer: Self-pay

## 2017-10-03 DIAGNOSIS — I8500 Esophageal varices without bleeding: Secondary | ICD-10-CM | POA: Diagnosis not present

## 2017-10-03 DIAGNOSIS — Z01812 Encounter for preprocedural laboratory examination: Secondary | ICD-10-CM | POA: Insufficient documentation

## 2017-10-03 LAB — CBC WITH DIFFERENTIAL/PLATELET
Basophils Absolute: 0.1 10*3/uL (ref 0.0–0.1)
Basophils Relative: 1 %
Eosinophils Absolute: 0.3 10*3/uL (ref 0.0–0.7)
Eosinophils Relative: 8 %
HEMATOCRIT: 31.3 % — AB (ref 36.0–46.0)
HEMOGLOBIN: 10 g/dL — AB (ref 12.0–15.0)
LYMPHS PCT: 20 %
Lymphs Abs: 0.7 10*3/uL (ref 0.7–4.0)
MCH: 27.2 pg (ref 26.0–34.0)
MCHC: 31.9 g/dL (ref 30.0–36.0)
MCV: 85.3 fL (ref 78.0–100.0)
MONO ABS: 0.4 10*3/uL (ref 0.1–1.0)
MONOS PCT: 11 %
NEUTROS ABS: 2 10*3/uL (ref 1.7–7.7)
NEUTROS PCT: 60 %
Platelets: 74 10*3/uL — ABNORMAL LOW (ref 150–400)
RBC: 3.67 MIL/uL — ABNORMAL LOW (ref 3.87–5.11)
RDW: 15.4 % (ref 11.5–15.5)
WBC: 3.5 10*3/uL — ABNORMAL LOW (ref 4.0–10.5)

## 2017-10-03 LAB — BASIC METABOLIC PANEL
ANION GAP: 8 (ref 5–15)
BUN: 20 mg/dL (ref 6–20)
CALCIUM: 9 mg/dL (ref 8.9–10.3)
CHLORIDE: 102 mmol/L (ref 101–111)
CO2: 22 mmol/L (ref 22–32)
CREATININE: 1.54 mg/dL — AB (ref 0.44–1.00)
GFR calc non Af Amer: 32 mL/min — ABNORMAL LOW (ref >60)
GFR, EST AFRICAN AMERICAN: 37 mL/min — AB (ref >60)
GLUCOSE: 310 mg/dL — AB (ref 65–99)
Potassium: 4.2 mmol/L (ref 3.5–5.1)
Sodium: 132 mmol/L — ABNORMAL LOW (ref 135–145)

## 2017-10-03 LAB — SURGICAL PCR SCREEN
MRSA, PCR: NEGATIVE
Staphylococcus aureus: POSITIVE — AB

## 2017-10-10 ENCOUNTER — Encounter (HOSPITAL_COMMUNITY): Admission: RE | Payer: Self-pay | Source: Ambulatory Visit

## 2017-10-10 ENCOUNTER — Ambulatory Visit (HOSPITAL_COMMUNITY): Admission: RE | Admit: 2017-10-10 | Payer: Medicare Other | Source: Ambulatory Visit | Admitting: Internal Medicine

## 2017-10-10 ENCOUNTER — Telehealth: Payer: Self-pay | Admitting: *Deleted

## 2017-10-10 ENCOUNTER — Telehealth: Payer: Self-pay | Admitting: Internal Medicine

## 2017-10-10 SURGERY — ESOPHAGOGASTRODUODENOSCOPY (EGD) WITH PROPOFOL
Anesthesia: Monitor Anesthesia Care

## 2017-10-10 NOTE — Telephone Encounter (Signed)
I had a VM on the phone this morning that pt needed to cancel her EGD with varices banding w/ propofol with rmr scheduled for today at 10:30am. The reason was she could not get there this early in the morning.   Called pt and LMOVM

## 2017-10-10 NOTE — Telephone Encounter (Signed)
See other note about spouse calling back

## 2017-10-10 NOTE — Telephone Encounter (Signed)
Noted  

## 2017-10-10 NOTE — Telephone Encounter (Signed)
Pt's husband called this morning to say patient will not be having her procedure today because their car is broke down and once it is fixed they will call back to reschedule. MS is aware.

## 2017-10-10 NOTE — Telephone Encounter (Signed)
FYI to [employer]

## 2017-10-14 ENCOUNTER — Other Ambulatory Visit: Payer: Self-pay

## 2017-10-14 DIAGNOSIS — K7460 Unspecified cirrhosis of liver: Secondary | ICD-10-CM

## 2017-11-06 ENCOUNTER — Other Ambulatory Visit (HOSPITAL_COMMUNITY)
Admission: RE | Admit: 2017-11-06 | Discharge: 2017-11-06 | Disposition: A | Discharge status: Home / Self Care | Payer: Medicare Other | Source: Ambulatory Visit | Attending: Gastroenterology | Admitting: Gastroenterology

## 2017-11-06 DIAGNOSIS — E1142 Type 2 diabetes mellitus with diabetic polyneuropathy: Secondary | ICD-10-CM | POA: Diagnosis not present

## 2017-11-06 DIAGNOSIS — I10 Essential (primary) hypertension: Secondary | ICD-10-CM | POA: Diagnosis not present

## 2017-11-06 DIAGNOSIS — K7460 Unspecified cirrhosis of liver: Secondary | ICD-10-CM | POA: Insufficient documentation

## 2017-11-06 DIAGNOSIS — E08610 Diabetes mellitus due to underlying condition with diabetic neuropathic arthropathy: Secondary | ICD-10-CM | POA: Diagnosis not present

## 2017-11-06 DIAGNOSIS — Z1389 Encounter for screening for other disorder: Secondary | ICD-10-CM | POA: Diagnosis not present

## 2017-11-06 DIAGNOSIS — E7849 Other hyperlipidemia: Secondary | ICD-10-CM | POA: Diagnosis not present

## 2017-11-06 DIAGNOSIS — K7469 Other cirrhosis of liver: Secondary | ICD-10-CM | POA: Diagnosis not present

## 2017-11-06 DIAGNOSIS — Z0000 Encounter for general adult medical examination without abnormal findings: Secondary | ICD-10-CM | POA: Diagnosis not present

## 2017-11-06 LAB — COMPREHENSIVE METABOLIC PANEL
ALBUMIN: 2.8 g/dL — AB (ref 3.5–5.0)
ALT: 28 U/L (ref 14–54)
AST: 40 U/L (ref 15–41)
Alkaline Phosphatase: 152 U/L — ABNORMAL HIGH (ref 38–126)
Anion gap: 9 (ref 5–15)
BUN: 23 mg/dL — AB (ref 6–20)
CHLORIDE: 97 mmol/L — AB (ref 101–111)
CO2: 23 mmol/L (ref 22–32)
CREATININE: 1.41 mg/dL — AB (ref 0.44–1.00)
Calcium: 9.1 mg/dL (ref 8.9–10.3)
GFR calc Af Amer: 41 mL/min — ABNORMAL LOW (ref >60)
GFR, EST NON AFRICAN AMERICAN: 35 mL/min — AB (ref >60)
GLUCOSE: 367 mg/dL — AB (ref 65–99)
Potassium: 4.2 mmol/L (ref 3.5–5.1)
Sodium: 129 mmol/L — ABNORMAL LOW (ref 135–145)
Total Bilirubin: 0.7 mg/dL (ref 0.3–1.2)
Total Protein: 7.4 g/dL (ref 6.5–8.1)

## 2017-11-06 LAB — PROTIME-INR
INR: 1.29
Prothrombin Time: 16 seconds — ABNORMAL HIGH (ref 11.4–15.2)

## 2017-11-17 NOTE — Progress Notes (Signed)
MELD 20, was 17 in July 2018.   Glucose significantly elevated as well. Needs to discuss glycemic control with her PCP or endocrinologist.   Patient needs to keep upcoming OV as planned.   Mardelle Matte, appears you ordered labs after last OV, old NIC for labs under my name as well.

## 2017-11-18 ENCOUNTER — Telehealth: Payer: Self-pay | Admitting: Internal Medicine

## 2017-11-18 NOTE — Telephone Encounter (Signed)
Tried calling pt, Mailbox is full. Will call pt back.

## 2017-11-18 NOTE — Telephone Encounter (Signed)
Spoke with pt, see lab note for documentation .

## 2017-11-18 NOTE — Telephone Encounter (Signed)
Pt was returning a call from earlier. Please call her back at [phone]

## 2017-11-27 ENCOUNTER — Encounter: Payer: Self-pay | Admitting: Nurse Practitioner

## 2017-11-27 ENCOUNTER — Ambulatory Visit: Payer: Medicare Other | Admitting: Nurse Practitioner

## 2017-11-27 ENCOUNTER — Telehealth: Payer: Self-pay | Admitting: Nurse Practitioner

## 2017-11-27 NOTE — Telephone Encounter (Signed)
Patient was a no show and letter sent  °

## 2017-11-27 NOTE — Progress Notes (Deleted)
Referring Provider: Neale Burly, MD Primary Care Physician:  Neale Burly, MD Primary GI:  Dr. Gala Romney  No chief complaint on file.   HPI:   Melissa Rose is a 77 y.o. female who presents for follow-up on cirrhosis and esophageal varices.  The patient was last seen in our office 08/28/2017 for the same as well as thrombocytopenia.  Previous variceal ligation June 2018.  Chronic lactulose daily with 1-3 semi-formed bowel movements daily.  Recommended hepatitis B vaccination.  Most recent EGD completed 06/03/2017 and outlined as per below.  Previous abdominal imaging with cirrhosis, splenomegaly, no suspicious lesions and was due at her last visit for updated imaging.  EGD was completed on 06/03/2017 which found a single column of grade 3 varices, 3 columns of grade 2 varices, portal hypertensive gastropathy in the stomach.  Acquired stenosis found in the first part of the duodenum which is easily traversed by the scope.  Varices were banded.  Recommended continue current medications, await pathology results, repeat upper endoscopy for surveillance and possible further ligation with bands.  At her last visit both the patient and her husband were poor historians.  She indicated she is doing well overall.  Noted subjective weight loss of 40 pounds in the previous 7 months with noted changes in her eating habits and eating less.  Objectively she was down about 13 pounds.  Noted early satiety.  No other hepatic symptoms or GI symptoms.  She was noted to not be on propranolol currently and they were unsure why she had stopped that; they feel she may have ran out and forgot to ask for refill.  Her heart rate off propranolol was 65 bpm and variceal prophylaxis indicates goal of 55-60.  Commended routine labs, right upper quadrant ultrasound, restart propranolol, repeat endoscopy with possible band placement, follow-up in 3 months.  Labs were drawn 11/06/2017 which found CMP with low sodium at 129,  elevated creatinine at 1.41 which is stable for her, normal AST/ALT, elevated alkaline phosphatase at 52, normal bilirubin 0.7.  INR 1.29.  Noted meld score of 20 (Child-Pugh likely B), was meld score of 17 in July 2018.  Recommended keep upcoming office visit, discussed glycemic control with primary care or endocrine.  Today she states   Past Medical History:  Diagnosis Date  . Arthritis   . Breast cancer ([place]) 2000   treated with chemo/radiation  . Dementia    slight  . Diabetes mellitus without complication ([place])   . GERD (gastroesophageal reflux disease)   . Hyperlipidemia   . Hypertension   . Hypothyroidism     Past Surgical History:  Procedure Laterality Date  . BREAST SURGERY Right   . 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 COLONOSCOPY  11/2016   Dr. Ladona Horns: normal  . ESOPHAGEAL BANDING N/A 02/13/2017   Procedure: ESOPHAGEAL BANDING;  Surgeon: Daneil Dolin, MD;  [facility];  Service: Endoscopy;  Laterality: N/A;  . ESOPHAGEAL BANDING N/A 03/25/2017   Procedure: ESOPHAGEAL BANDING;  Surgeon: Daneil Dolin, MD;  [facility];  Service: Endoscopy;  Laterality: N/A;  Esophageal varices banding  . ESOPHAGEAL BANDING N/A 06/03/2017   Procedure: ESOPHAGEAL BANDING;  Surgeon: Daneil Dolin, MD;  [facility];  Service: Endoscopy;  Laterality: N/A;  varices  . ESOPHAGOGASTRODUODENOSCOPY  11/2016   Dr. Ladona Horns: Short stricture in the first part of the duodenum, not  traversable., There were varices in the distal esophagus which were not bleeding.  . ESOPHAGOGASTRODUODENOSCOPY (EGD) WITH PROPOFOL N/A 02/13/2017   Procedure: ESOPHAGOGASTRODUODENOSCOPY (EGD) WITH PROPOFOL;  Surgeon: Daneil Dolin, MD;  [facility];  Service: Endoscopy;  Laterality: N/A;  145   . ESOPHAGOGASTRODUODENOSCOPY (EGD) WITH PROPOFOL N/A 03/25/2017   Procedure: ESOPHAGOGASTRODUODENOSCOPY (EGD) WITH  PROPOFOL;  Surgeon: Daneil Dolin, MD;  [facility];  Service: Endoscopy;  Laterality: N/A;  7:30am  . ESOPHAGOGASTRODUODENOSCOPY (EGD) WITH PROPOFOL N/A 06/03/2017   Procedure: ESOPHAGOGASTRODUODENOSCOPY (EGD) WITH PROPOFOL;  Surgeon: Daneil Dolin, MD;  [facility];  Service: Endoscopy;  Laterality: N/A;  8:30am    Current Outpatient Medications  Medication Sig Dispense Refill  . atorvastatin (LIPITOR) 10 MG tablet Take 10 mg by mouth at bedtime.     . Calcium Carb-Cholecalciferol (CALCIUM 600/VITAMIN D3) 600-800 MG-UNIT TABS Take 1 tablet by mouth daily.    Marland Kitchen donepezil (ARICEPT) 10 MG tablet Take 10 mg by mouth every evening.     . furosemide (LASIX) 20 MG tablet Take 20 mg by mouth daily.    Marland Kitchen glipiZIDE (GLUCOTROL) 5 MG tablet Take 5 mg by mouth 2 (two) times daily.    . insulin aspart (NOVOLOG FLEXPEN) 100 UNIT/ML FlexPen Inject 35 Units into the skin 2 (two) times daily with a meal.    . lactulose (CHRONULAC) 10 GM/15ML solution Take 10 g by mouth 2 (two) times daily.    Marland Kitchen levothyroxine (SYNTHROID, LEVOTHROID) 100 MCG tablet Take 100 mcg by mouth daily.     Marland Kitchen lisinopril (PRINIVIL,ZESTRIL) 10 MG tablet Take 10 mg by mouth daily.    . metFORMIN (GLUCOPHAGE) 1000 MG tablet Take 1,000 mg by mouth 2 (two) times daily.    . mupirocin nasal ointment (BACTROBAN) 2 % Place 1 application into the nose 2 (two) times daily. Use one-half of tube in each nostril twice daily for five (5) days. After application, press sides of nose together and gently massage.    . propranolol (INDERAL) 10 MG tablet Take 1 tablet (10 mg total) by mouth 2 (two) times daily. 60 tablet 5  . ranitidine (ZANTAC) 300 MG tablet Take 300 mg by mouth 2 (two) times daily.     Marland Kitchen spironolactone (ALDACTONE) 25 MG tablet Take 12.5 mg by mouth 2 (two) times daily.     No current facility-administered medications for this visit.     Allergies as of 11/27/2017  . (No Known Allergies)    Family History    Problem Relation Age of Onset  . Diabetes Mother   . Diabetes Father   . Heart disease Father   . Cerebral palsy Sister   . Liver disease Neg Hx   . Colon cancer Neg Hx     Social History   Socioeconomic History  . Marital status: Married    Spouse name: Not on file  . Number of children: Not on file  . Years of education: Not on file  . Highest education level: Not on file  Social Needs  . Financial resource strain: Not on file  . Food insecurity - worry: Not on file  . Food insecurity - inability: Not on file  . Transportation needs - medical: Not on file  . Transportation needs - non-medical: Not on file  Occupational History  . Not on file  Tobacco Use  . Smoking status: Never Smoker  . Smokeless tobacco: Never Used  Substance and Sexual Activity  .  Alcohol use: No  . Drug use: No  . Sexual activity: Not Currently    Birth control/protection: None  Other Topics Concern  . Not on file  Social History Narrative  . Not on file    Review of Systems: General: Negative for anorexia, weight loss, fever, chills, fatigue, weakness. Eyes: Negative for vision changes.  ENT: Negative for hoarseness, difficulty swallowing , nasal congestion. CV: Negative for chest pain, angina, palpitations, dyspnea on exertion, peripheral edema.  Respiratory: Negative for dyspnea at rest, dyspnea on exertion, cough, sputum, wheezing.  GI: See history of present illness. GU:  Negative for dysuria, hematuria, urinary incontinence, urinary frequency, nocturnal urination.  MS: Negative for joint pain, low back pain.  Derm: Negative for rash or itching.  Neuro: Negative for weakness, abnormal sensation, seizure, frequent headaches, memory loss, confusion.  Psych: Negative for anxiety, depression, suicidal ideation, hallucinations.  Endo: Negative for unusual weight change.  Heme: Negative for bruising or bleeding. Allergy: Negative for rash or hives.   Physical Exam: There were no vitals  taken for this visit. General:   Alert and oriented. Pleasant and cooperative. Well-nourished and well-developed.  Head:  Normocephalic and atraumatic. Eyes:  Without icterus, sclera clear and conjunctiva pink.  Ears:  Normal auditory acuity. Mouth:  No deformity or lesions, oral mucosa pink.  Throat/Neck:  Supple, without mass or thyromegaly. Cardiovascular:  S1, S2 present without murmurs appreciated. Normal pulses noted. Extremities without clubbing or edema. Respiratory:  Clear to auscultation bilaterally. No wheezes, rales, or rhonchi. No distress.  Gastrointestinal:  +BS, soft, non-tender and non-distended. No HSM noted. No guarding or rebound. No masses appreciated.  Rectal:  Deferred  Musculoskalatal:  Symmetrical without gross deformities. Normal posture. Skin:  Intact without significant lesions or rashes. Neurologic:  Alert and oriented x4;  grossly normal neurologically. Psych:  Alert and cooperative. Normal mood and affect. Heme/Lymph/Immune: No significant cervical adenopathy. No excessive bruising noted.    11/27/2017 8:18 AM   Disclaimer: This note was dictated with voice recognition software. Similar sounding words can inadvertently be transcribed and may not be corrected upon review.

## 2017-11-27 NOTE — Telephone Encounter (Signed)
Noted  

## 2018-02-05 DIAGNOSIS — K7469 Other cirrhosis of liver: Secondary | ICD-10-CM | POA: Diagnosis not present

## 2018-02-05 DIAGNOSIS — I10 Essential (primary) hypertension: Secondary | ICD-10-CM | POA: Diagnosis not present

## 2018-02-05 DIAGNOSIS — E1142 Type 2 diabetes mellitus with diabetic polyneuropathy: Secondary | ICD-10-CM | POA: Diagnosis not present

## 2018-02-05 DIAGNOSIS — E7849 Other hyperlipidemia: Secondary | ICD-10-CM | POA: Diagnosis not present

## 2018-02-05 DIAGNOSIS — M174 Other bilateral secondary osteoarthritis of knee: Secondary | ICD-10-CM | POA: Diagnosis not present

## 2018-02-08 IMAGING — CT CT CHEST W/ CM
2 of 6 series · 13 of 36 positions shown, 16 images · IV contrast (iopamidol)
Comparison: 07/06/2014

CLINICAL DATA: Follow-up pulmonary nodule

EXAM:
CT CHEST WITH CONTRAST
TECHNIQUE: Multidetector CT imaging of the chest was performed during
intravenous contrast administration.
CONTRAST:  60mL [id number] IOPAMIDOL (1Z22V1-ZVV) INJECTION 61%

[Series 2: axial st · axial · 0.62mm/px · z∈[+1292,+1526]mm · 12 of 129 slices shown, 15 images]
[im 6/129  mediastinal]
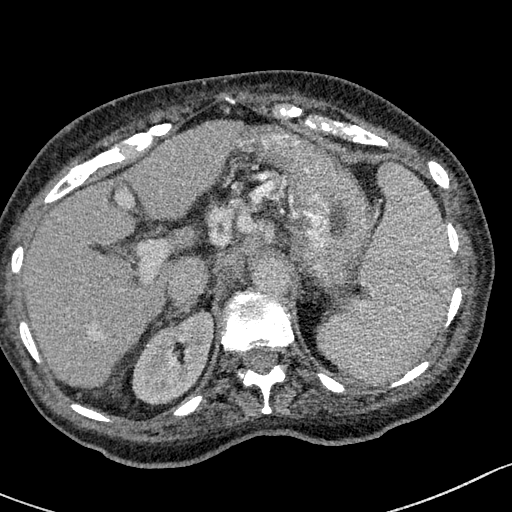
[im 6/129  lung]
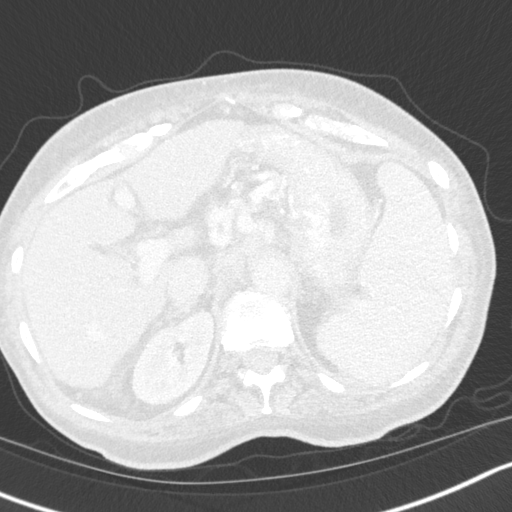
[im 17/129  lung]
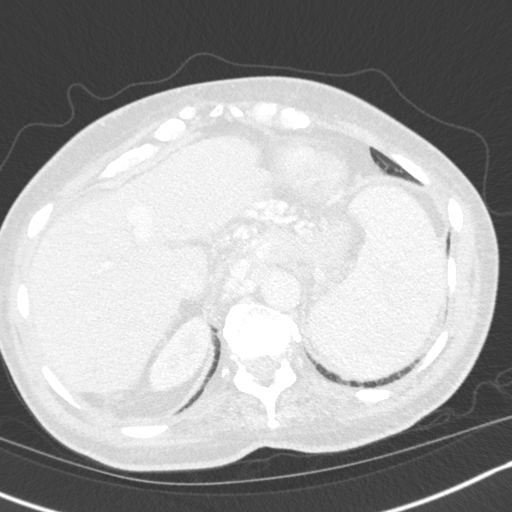
[im 28/129  lung]
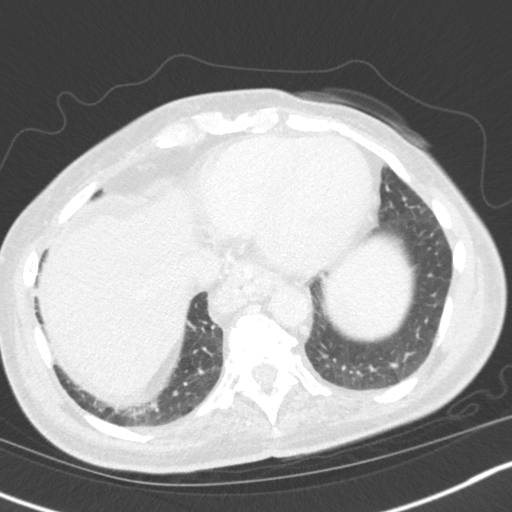
[im 39/129  lung]
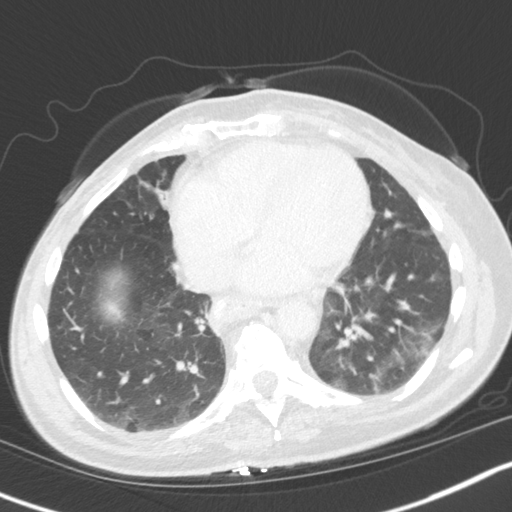
[im 51/129  mediastinal]
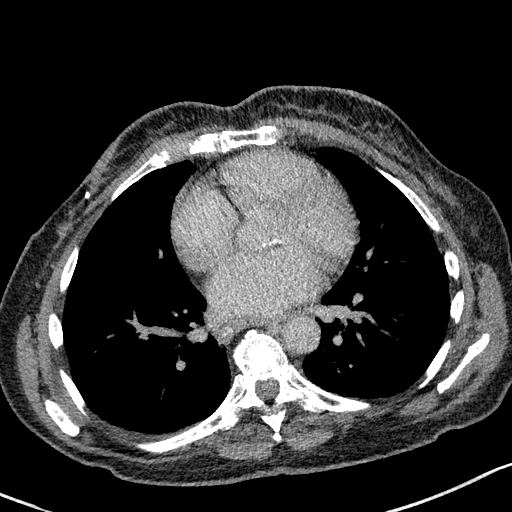
[im 51/129  lung]
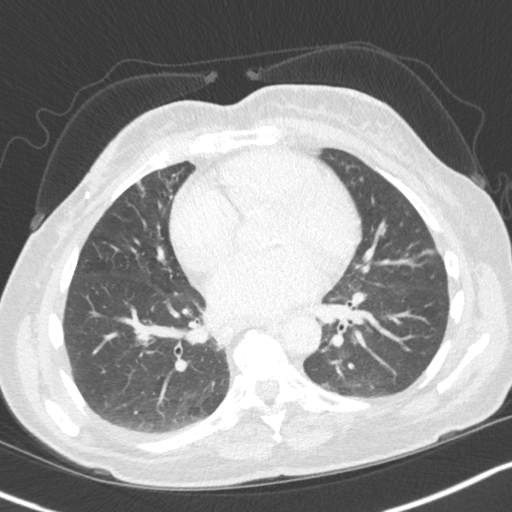
[im 62/129  lung]
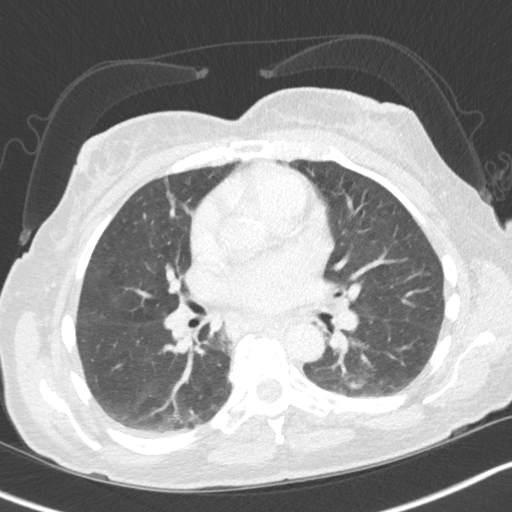
[im 67/129  lung]
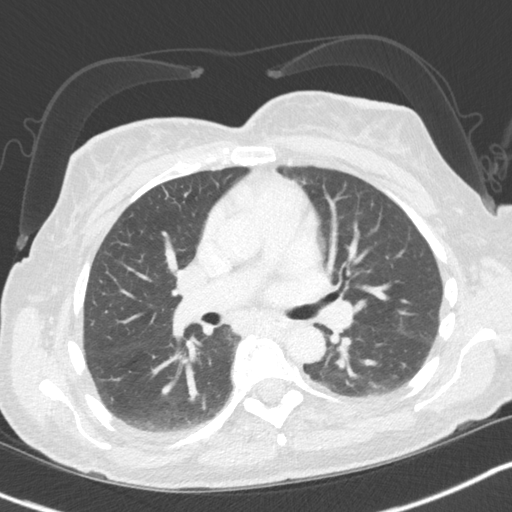
[im 78/129  lung]
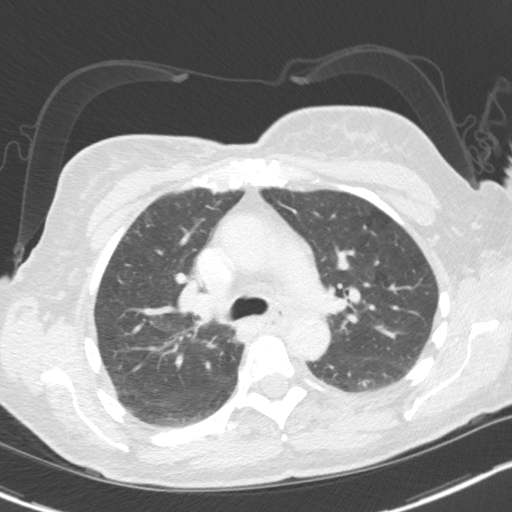
[im 90/129  mediastinal]
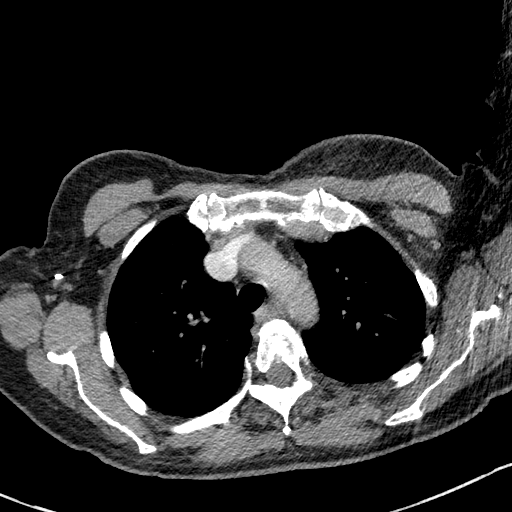
[im 90/129  lung]
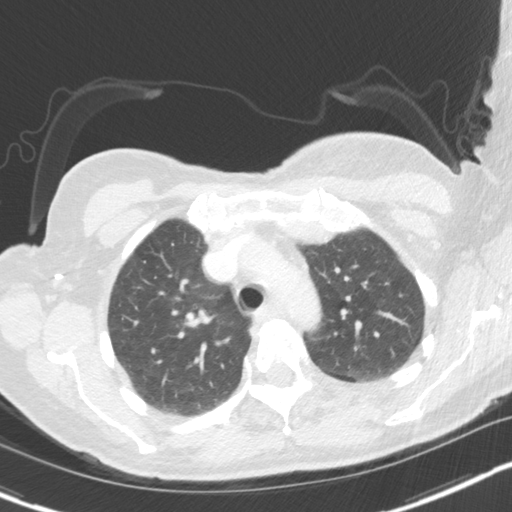
[im 101/129  lung]
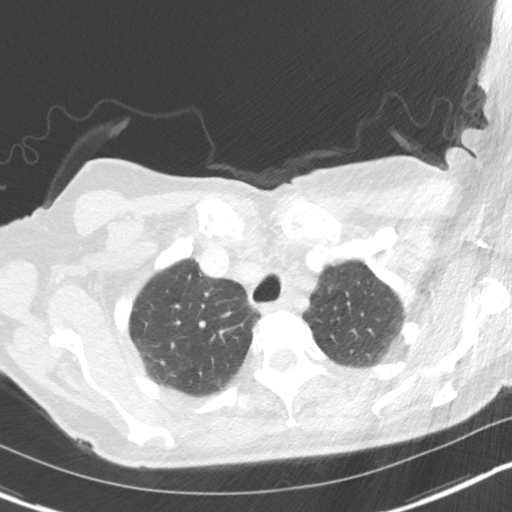
[im 112/129  lung]
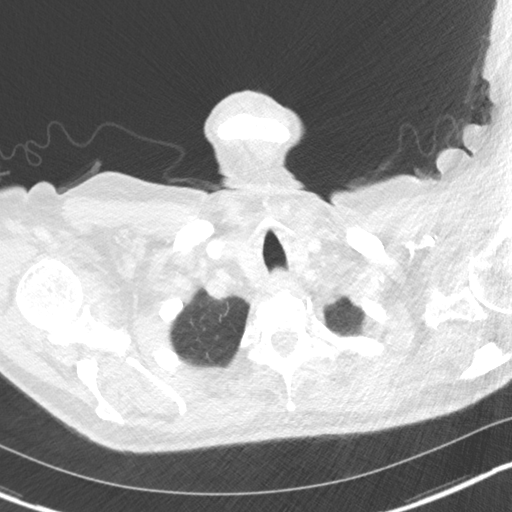
[im 123/129  lung]
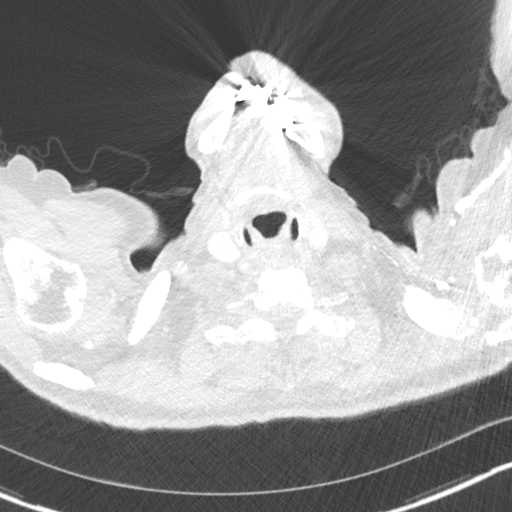

[Series 9: coronal · coronal · 0.18mm/px · 1 of 131 slices shown]
[im 66/131  lung]
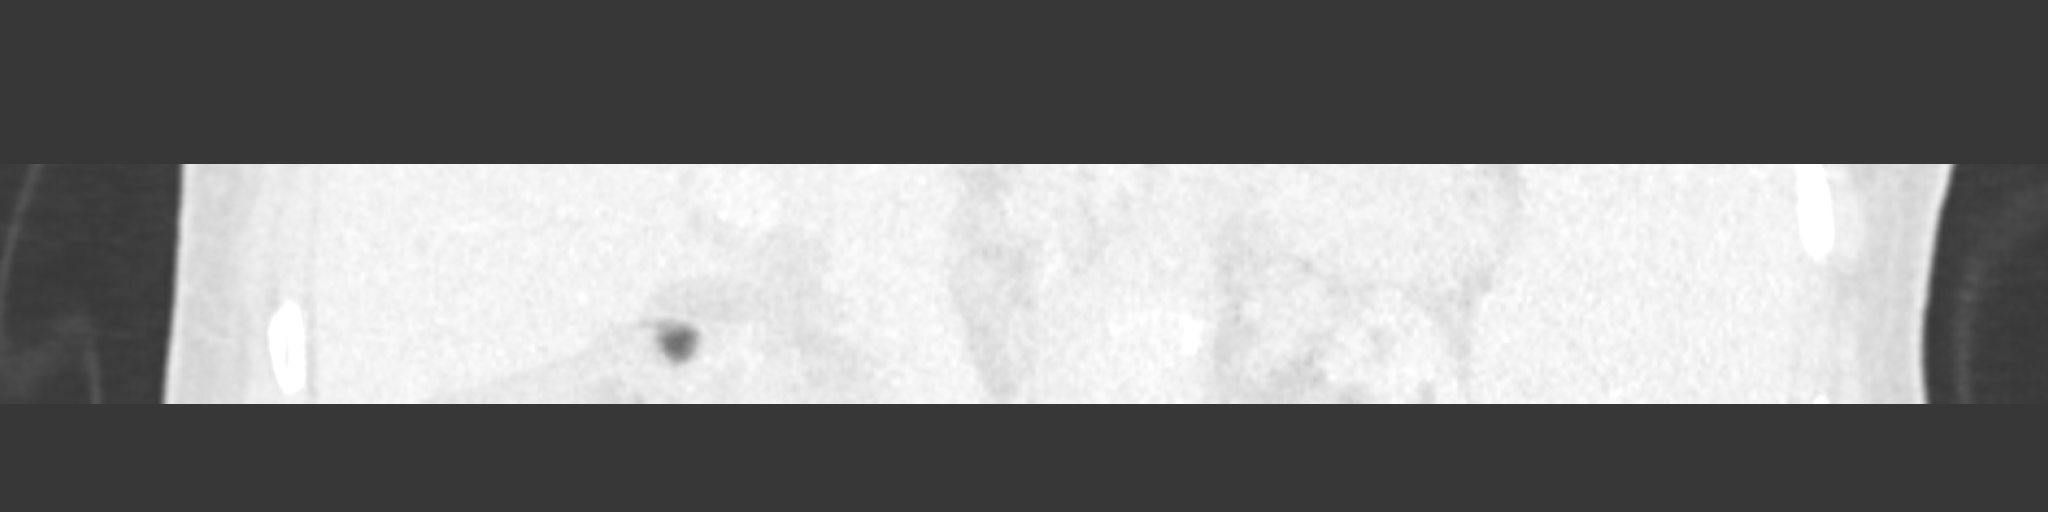

[13 of 36 positions shown; findings below may reference images not displayed]

FINDINGS: Cardiovascular: Thoracic aorta and its branches demonstrate
atherosclerotic calcifications. No aneurysmal dilatation is noted.
No cardiac enlargement is seen.

Mediastinum/Nodes: Thoracic inlet is within normal limits. No hilar
or mediastinal adenopathy is noted. The esophagus as visualized is
within normal limits with the exception of multiple esophageal
varices distally.

Lungs/Pleura: The lungs are well aerated bilaterally. The previously
seen left upper lobe nodule has regressed significantly in the
interval from the prior exam with only a small residual scar
remaining. No new focal nodules are seen. No focal infiltrate is
noted. Mild dependent atelectatic changes are noted in the bases. No
sizable effusion is seen.

Upper Abdomen: Cirrhotic changes of the liver are noted with
enlargement of the portal vein. Multiple gastric and esophageal
varices are seen.

Musculoskeletal: Degenerative changes of the thoracic spine are
noted.
IMPRESSION: Changes consistent with hepatic cirrhosis and esophageal and gastric
varices.

Significant reduction in size in previously seen left upper lobe
nodule. No further follow-up is required.

No new nodule is seen.

## 2018-05-12 DIAGNOSIS — M174 Other bilateral secondary osteoarthritis of knee: Secondary | ICD-10-CM | POA: Diagnosis not present

## 2018-05-12 DIAGNOSIS — E7849 Other hyperlipidemia: Secondary | ICD-10-CM | POA: Diagnosis not present

## 2018-05-12 DIAGNOSIS — I10 Essential (primary) hypertension: Secondary | ICD-10-CM | POA: Diagnosis not present

## 2018-05-12 DIAGNOSIS — K7469 Other cirrhosis of liver: Secondary | ICD-10-CM | POA: Diagnosis not present

## 2018-05-12 DIAGNOSIS — E1142 Type 2 diabetes mellitus with diabetic polyneuropathy: Secondary | ICD-10-CM | POA: Diagnosis not present

## 2018-08-07 DIAGNOSIS — I872 Venous insufficiency (chronic) (peripheral): Secondary | ICD-10-CM | POA: Diagnosis not present

## 2018-08-13 DIAGNOSIS — I10 Essential (primary) hypertension: Secondary | ICD-10-CM | POA: Diagnosis not present

## 2018-08-13 DIAGNOSIS — E038 Other specified hypothyroidism: Secondary | ICD-10-CM | POA: Diagnosis not present

## 2018-08-13 DIAGNOSIS — I872 Venous insufficiency (chronic) (peripheral): Secondary | ICD-10-CM | POA: Diagnosis not present

## 2018-08-13 DIAGNOSIS — Z0000 Encounter for general adult medical examination without abnormal findings: Secondary | ICD-10-CM | POA: Diagnosis not present

## 2018-08-13 DIAGNOSIS — E1165 Type 2 diabetes mellitus with hyperglycemia: Secondary | ICD-10-CM | POA: Diagnosis not present

## 2018-11-08 DIAGNOSIS — E039 Hypothyroidism, unspecified: Secondary | ICD-10-CM | POA: Diagnosis not present

## 2018-11-08 DIAGNOSIS — R0902 Hypoxemia: Secondary | ICD-10-CM | POA: Diagnosis not present

## 2018-11-08 DIAGNOSIS — R52 Pain, unspecified: Secondary | ICD-10-CM | POA: Diagnosis not present

## 2018-11-08 DIAGNOSIS — I10 Essential (primary) hypertension: Secondary | ICD-10-CM | POA: Diagnosis not present

## 2018-11-08 DIAGNOSIS — K7200 Acute and subacute hepatic failure without coma: Secondary | ICD-10-CM | POA: Diagnosis not present

## 2018-11-08 DIAGNOSIS — Z7984 Long term (current) use of oral hypoglycemic drugs: Secondary | ICD-10-CM | POA: Diagnosis not present

## 2018-11-08 DIAGNOSIS — Z923 Personal history of irradiation: Secondary | ICD-10-CM | POA: Diagnosis not present

## 2018-11-08 DIAGNOSIS — N289 Disorder of kidney and ureter, unspecified: Secondary | ICD-10-CM | POA: Diagnosis not present

## 2018-11-08 DIAGNOSIS — D6959 Other secondary thrombocytopenia: Secondary | ICD-10-CM | POA: Diagnosis not present

## 2018-11-08 DIAGNOSIS — Z79899 Other long term (current) drug therapy: Secondary | ICD-10-CM | POA: Diagnosis not present

## 2018-11-08 DIAGNOSIS — K7460 Unspecified cirrhosis of liver: Secondary | ICD-10-CM | POA: Diagnosis not present

## 2018-11-08 DIAGNOSIS — L03116 Cellulitis of left lower limb: Secondary | ICD-10-CM | POA: Diagnosis not present

## 2018-11-08 DIAGNOSIS — E1140 Type 2 diabetes mellitus with diabetic neuropathy, unspecified: Secondary | ICD-10-CM | POA: Diagnosis not present

## 2018-11-08 DIAGNOSIS — L03115 Cellulitis of right lower limb: Secondary | ICD-10-CM | POA: Diagnosis not present

## 2018-11-08 DIAGNOSIS — Z9221 Personal history of antineoplastic chemotherapy: Secondary | ICD-10-CM | POA: Diagnosis not present

## 2018-11-08 DIAGNOSIS — E785 Hyperlipidemia, unspecified: Secondary | ICD-10-CM | POA: Diagnosis not present

## 2018-11-08 DIAGNOSIS — D638 Anemia in other chronic diseases classified elsewhere: Secondary | ICD-10-CM | POA: Diagnosis not present

## 2018-11-08 DIAGNOSIS — E1165 Type 2 diabetes mellitus with hyperglycemia: Secondary | ICD-10-CM | POA: Diagnosis not present

## 2018-11-08 DIAGNOSIS — J984 Other disorders of lung: Secondary | ICD-10-CM | POA: Diagnosis not present

## 2018-11-08 DIAGNOSIS — R410 Disorientation, unspecified: Secondary | ICD-10-CM | POA: Diagnosis not present

## 2018-11-08 DIAGNOSIS — E11628 Type 2 diabetes mellitus with other skin complications: Secondary | ICD-10-CM | POA: Diagnosis not present

## 2018-11-08 DIAGNOSIS — Z853 Personal history of malignant neoplasm of breast: Secondary | ICD-10-CM | POA: Diagnosis not present

## 2018-11-08 DIAGNOSIS — Z780 Asymptomatic menopausal state: Secondary | ICD-10-CM | POA: Diagnosis not present

## 2018-11-14 DIAGNOSIS — L03115 Cellulitis of right lower limb: Secondary | ICD-10-CM | POA: Diagnosis not present

## 2018-11-14 DIAGNOSIS — D638 Anemia in other chronic diseases classified elsewhere: Secondary | ICD-10-CM | POA: Diagnosis not present

## 2018-11-14 DIAGNOSIS — Z4800 Encounter for change or removal of nonsurgical wound dressing: Secondary | ICD-10-CM | POA: Diagnosis not present

## 2018-11-14 DIAGNOSIS — Z853 Personal history of malignant neoplasm of breast: Secondary | ICD-10-CM | POA: Diagnosis not present

## 2018-11-14 DIAGNOSIS — E119 Type 2 diabetes mellitus without complications: Secondary | ICD-10-CM | POA: Diagnosis not present

## 2018-11-14 DIAGNOSIS — E039 Hypothyroidism, unspecified: Secondary | ICD-10-CM | POA: Diagnosis not present

## 2018-11-14 DIAGNOSIS — Z7984 Long term (current) use of oral hypoglycemic drugs: Secondary | ICD-10-CM | POA: Diagnosis not present

## 2018-11-14 DIAGNOSIS — D696 Thrombocytopenia, unspecified: Secondary | ICD-10-CM | POA: Diagnosis not present

## 2018-11-14 DIAGNOSIS — E785 Hyperlipidemia, unspecified: Secondary | ICD-10-CM | POA: Diagnosis not present

## 2018-11-14 DIAGNOSIS — K7460 Unspecified cirrhosis of liver: Secondary | ICD-10-CM | POA: Diagnosis not present

## 2018-11-14 DIAGNOSIS — L03116 Cellulitis of left lower limb: Secondary | ICD-10-CM | POA: Diagnosis not present

## 2018-11-15 DIAGNOSIS — E119 Type 2 diabetes mellitus without complications: Secondary | ICD-10-CM | POA: Diagnosis not present

## 2018-11-15 DIAGNOSIS — D638 Anemia in other chronic diseases classified elsewhere: Secondary | ICD-10-CM | POA: Diagnosis not present

## 2018-11-15 DIAGNOSIS — L03116 Cellulitis of left lower limb: Secondary | ICD-10-CM | POA: Diagnosis not present

## 2018-11-15 DIAGNOSIS — L03115 Cellulitis of right lower limb: Secondary | ICD-10-CM | POA: Diagnosis not present

## 2018-11-16 DIAGNOSIS — L03115 Cellulitis of right lower limb: Secondary | ICD-10-CM | POA: Diagnosis not present

## 2018-11-16 DIAGNOSIS — K7460 Unspecified cirrhosis of liver: Secondary | ICD-10-CM | POA: Diagnosis not present

## 2018-11-16 DIAGNOSIS — Z853 Personal history of malignant neoplasm of breast: Secondary | ICD-10-CM | POA: Diagnosis not present

## 2018-11-16 DIAGNOSIS — Z4800 Encounter for change or removal of nonsurgical wound dressing: Secondary | ICD-10-CM | POA: Diagnosis not present

## 2018-11-16 DIAGNOSIS — Z7984 Long term (current) use of oral hypoglycemic drugs: Secondary | ICD-10-CM | POA: Diagnosis not present

## 2018-11-16 DIAGNOSIS — E039 Hypothyroidism, unspecified: Secondary | ICD-10-CM | POA: Diagnosis not present

## 2018-11-16 DIAGNOSIS — D696 Thrombocytopenia, unspecified: Secondary | ICD-10-CM | POA: Diagnosis not present

## 2018-11-16 DIAGNOSIS — L03116 Cellulitis of left lower limb: Secondary | ICD-10-CM | POA: Diagnosis not present

## 2018-11-16 DIAGNOSIS — D638 Anemia in other chronic diseases classified elsewhere: Secondary | ICD-10-CM | POA: Diagnosis not present

## 2018-11-16 DIAGNOSIS — E785 Hyperlipidemia, unspecified: Secondary | ICD-10-CM | POA: Diagnosis not present

## 2018-11-16 DIAGNOSIS — E119 Type 2 diabetes mellitus without complications: Secondary | ICD-10-CM | POA: Diagnosis not present

## 2018-11-17 DIAGNOSIS — E119 Type 2 diabetes mellitus without complications: Secondary | ICD-10-CM | POA: Diagnosis not present

## 2018-11-17 DIAGNOSIS — L03115 Cellulitis of right lower limb: Secondary | ICD-10-CM | POA: Diagnosis not present

## 2018-11-17 DIAGNOSIS — Z4800 Encounter for change or removal of nonsurgical wound dressing: Secondary | ICD-10-CM | POA: Diagnosis not present

## 2018-11-17 DIAGNOSIS — D638 Anemia in other chronic diseases classified elsewhere: Secondary | ICD-10-CM | POA: Diagnosis not present

## 2018-11-17 DIAGNOSIS — K7460 Unspecified cirrhosis of liver: Secondary | ICD-10-CM | POA: Diagnosis not present

## 2018-11-17 DIAGNOSIS — Z853 Personal history of malignant neoplasm of breast: Secondary | ICD-10-CM | POA: Diagnosis not present

## 2018-11-17 DIAGNOSIS — Z7984 Long term (current) use of oral hypoglycemic drugs: Secondary | ICD-10-CM | POA: Diagnosis not present

## 2018-11-17 DIAGNOSIS — D696 Thrombocytopenia, unspecified: Secondary | ICD-10-CM | POA: Diagnosis not present

## 2018-11-17 DIAGNOSIS — E785 Hyperlipidemia, unspecified: Secondary | ICD-10-CM | POA: Diagnosis not present

## 2018-11-17 DIAGNOSIS — E039 Hypothyroidism, unspecified: Secondary | ICD-10-CM | POA: Diagnosis not present

## 2018-11-17 DIAGNOSIS — L03116 Cellulitis of left lower limb: Secondary | ICD-10-CM | POA: Diagnosis not present

## 2018-11-18 DIAGNOSIS — Z7984 Long term (current) use of oral hypoglycemic drugs: Secondary | ICD-10-CM | POA: Diagnosis not present

## 2018-11-18 DIAGNOSIS — L03115 Cellulitis of right lower limb: Secondary | ICD-10-CM | POA: Diagnosis not present

## 2018-11-18 DIAGNOSIS — I10 Essential (primary) hypertension: Secondary | ICD-10-CM | POA: Diagnosis not present

## 2018-11-18 DIAGNOSIS — E039 Hypothyroidism, unspecified: Secondary | ICD-10-CM | POA: Diagnosis not present

## 2018-11-18 DIAGNOSIS — Z1389 Encounter for screening for other disorder: Secondary | ICD-10-CM | POA: Diagnosis not present

## 2018-11-18 DIAGNOSIS — E038 Other specified hypothyroidism: Secondary | ICD-10-CM | POA: Diagnosis not present

## 2018-11-18 DIAGNOSIS — K7469 Other cirrhosis of liver: Secondary | ICD-10-CM | POA: Diagnosis not present

## 2018-11-18 DIAGNOSIS — Z4800 Encounter for change or removal of nonsurgical wound dressing: Secondary | ICD-10-CM | POA: Diagnosis not present

## 2018-11-18 DIAGNOSIS — E119 Type 2 diabetes mellitus without complications: Secondary | ICD-10-CM | POA: Diagnosis not present

## 2018-11-18 DIAGNOSIS — D696 Thrombocytopenia, unspecified: Secondary | ICD-10-CM | POA: Diagnosis not present

## 2018-11-18 DIAGNOSIS — D638 Anemia in other chronic diseases classified elsewhere: Secondary | ICD-10-CM | POA: Diagnosis not present

## 2018-11-18 DIAGNOSIS — L03116 Cellulitis of left lower limb: Secondary | ICD-10-CM | POA: Diagnosis not present

## 2018-11-18 DIAGNOSIS — E785 Hyperlipidemia, unspecified: Secondary | ICD-10-CM | POA: Diagnosis not present

## 2018-11-18 DIAGNOSIS — I872 Venous insufficiency (chronic) (peripheral): Secondary | ICD-10-CM | POA: Diagnosis not present

## 2018-11-18 DIAGNOSIS — K7460 Unspecified cirrhosis of liver: Secondary | ICD-10-CM | POA: Diagnosis not present

## 2018-11-18 DIAGNOSIS — E1143 Type 2 diabetes mellitus with diabetic autonomic (poly)neuropathy: Secondary | ICD-10-CM | POA: Diagnosis not present

## 2018-11-18 DIAGNOSIS — Z0000 Encounter for general adult medical examination without abnormal findings: Secondary | ICD-10-CM | POA: Diagnosis not present

## 2018-11-18 DIAGNOSIS — Z853 Personal history of malignant neoplasm of breast: Secondary | ICD-10-CM | POA: Diagnosis not present

## 2018-11-22 DIAGNOSIS — E785 Hyperlipidemia, unspecified: Secondary | ICD-10-CM | POA: Diagnosis not present

## 2018-11-22 DIAGNOSIS — L03115 Cellulitis of right lower limb: Secondary | ICD-10-CM | POA: Diagnosis not present

## 2018-11-22 DIAGNOSIS — Z853 Personal history of malignant neoplasm of breast: Secondary | ICD-10-CM | POA: Diagnosis not present

## 2018-11-22 DIAGNOSIS — D696 Thrombocytopenia, unspecified: Secondary | ICD-10-CM | POA: Diagnosis not present

## 2018-11-22 DIAGNOSIS — E119 Type 2 diabetes mellitus without complications: Secondary | ICD-10-CM | POA: Diagnosis not present

## 2018-11-22 DIAGNOSIS — D638 Anemia in other chronic diseases classified elsewhere: Secondary | ICD-10-CM | POA: Diagnosis not present

## 2018-11-22 DIAGNOSIS — Z4800 Encounter for change or removal of nonsurgical wound dressing: Secondary | ICD-10-CM | POA: Diagnosis not present

## 2018-11-22 DIAGNOSIS — L03116 Cellulitis of left lower limb: Secondary | ICD-10-CM | POA: Diagnosis not present

## 2018-11-22 DIAGNOSIS — E039 Hypothyroidism, unspecified: Secondary | ICD-10-CM | POA: Diagnosis not present

## 2018-11-22 DIAGNOSIS — K7460 Unspecified cirrhosis of liver: Secondary | ICD-10-CM | POA: Diagnosis not present

## 2018-11-22 DIAGNOSIS — Z7984 Long term (current) use of oral hypoglycemic drugs: Secondary | ICD-10-CM | POA: Diagnosis not present

## 2018-11-25 DIAGNOSIS — Z7984 Long term (current) use of oral hypoglycemic drugs: Secondary | ICD-10-CM | POA: Diagnosis not present

## 2018-11-25 DIAGNOSIS — D696 Thrombocytopenia, unspecified: Secondary | ICD-10-CM | POA: Diagnosis not present

## 2018-11-25 DIAGNOSIS — E785 Hyperlipidemia, unspecified: Secondary | ICD-10-CM | POA: Diagnosis not present

## 2018-11-25 DIAGNOSIS — L03115 Cellulitis of right lower limb: Secondary | ICD-10-CM | POA: Diagnosis not present

## 2018-11-25 DIAGNOSIS — L03116 Cellulitis of left lower limb: Secondary | ICD-10-CM | POA: Diagnosis not present

## 2018-11-25 DIAGNOSIS — Z853 Personal history of malignant neoplasm of breast: Secondary | ICD-10-CM | POA: Diagnosis not present

## 2018-11-25 DIAGNOSIS — E119 Type 2 diabetes mellitus without complications: Secondary | ICD-10-CM | POA: Diagnosis not present

## 2018-11-25 DIAGNOSIS — Z4800 Encounter for change or removal of nonsurgical wound dressing: Secondary | ICD-10-CM | POA: Diagnosis not present

## 2018-11-25 DIAGNOSIS — D638 Anemia in other chronic diseases classified elsewhere: Secondary | ICD-10-CM | POA: Diagnosis not present

## 2018-11-25 DIAGNOSIS — K7460 Unspecified cirrhosis of liver: Secondary | ICD-10-CM | POA: Diagnosis not present

## 2018-11-25 DIAGNOSIS — E039 Hypothyroidism, unspecified: Secondary | ICD-10-CM | POA: Diagnosis not present

## 2018-11-28 DIAGNOSIS — E785 Hyperlipidemia, unspecified: Secondary | ICD-10-CM | POA: Diagnosis not present

## 2018-11-28 DIAGNOSIS — L03116 Cellulitis of left lower limb: Secondary | ICD-10-CM | POA: Diagnosis not present

## 2018-11-28 DIAGNOSIS — D638 Anemia in other chronic diseases classified elsewhere: Secondary | ICD-10-CM | POA: Diagnosis not present

## 2018-11-28 DIAGNOSIS — Z4800 Encounter for change or removal of nonsurgical wound dressing: Secondary | ICD-10-CM | POA: Diagnosis not present

## 2018-11-28 DIAGNOSIS — Z853 Personal history of malignant neoplasm of breast: Secondary | ICD-10-CM | POA: Diagnosis not present

## 2018-11-28 DIAGNOSIS — E119 Type 2 diabetes mellitus without complications: Secondary | ICD-10-CM | POA: Diagnosis not present

## 2018-11-28 DIAGNOSIS — Z7984 Long term (current) use of oral hypoglycemic drugs: Secondary | ICD-10-CM | POA: Diagnosis not present

## 2018-11-28 DIAGNOSIS — D696 Thrombocytopenia, unspecified: Secondary | ICD-10-CM | POA: Diagnosis not present

## 2018-11-28 DIAGNOSIS — L03115 Cellulitis of right lower limb: Secondary | ICD-10-CM | POA: Diagnosis not present

## 2018-11-28 DIAGNOSIS — K7460 Unspecified cirrhosis of liver: Secondary | ICD-10-CM | POA: Diagnosis not present

## 2018-11-28 DIAGNOSIS — E039 Hypothyroidism, unspecified: Secondary | ICD-10-CM | POA: Diagnosis not present

## 2018-11-29 DIAGNOSIS — Z7984 Long term (current) use of oral hypoglycemic drugs: Secondary | ICD-10-CM | POA: Diagnosis not present

## 2018-11-29 DIAGNOSIS — Z4800 Encounter for change or removal of nonsurgical wound dressing: Secondary | ICD-10-CM | POA: Diagnosis not present

## 2018-11-29 DIAGNOSIS — D638 Anemia in other chronic diseases classified elsewhere: Secondary | ICD-10-CM | POA: Diagnosis not present

## 2018-11-29 DIAGNOSIS — K7460 Unspecified cirrhosis of liver: Secondary | ICD-10-CM | POA: Diagnosis not present

## 2018-11-29 DIAGNOSIS — L03116 Cellulitis of left lower limb: Secondary | ICD-10-CM | POA: Diagnosis not present

## 2018-11-29 DIAGNOSIS — E039 Hypothyroidism, unspecified: Secondary | ICD-10-CM | POA: Diagnosis not present

## 2018-11-29 DIAGNOSIS — E119 Type 2 diabetes mellitus without complications: Secondary | ICD-10-CM | POA: Diagnosis not present

## 2018-11-29 DIAGNOSIS — L03115 Cellulitis of right lower limb: Secondary | ICD-10-CM | POA: Diagnosis not present

## 2018-11-29 DIAGNOSIS — D696 Thrombocytopenia, unspecified: Secondary | ICD-10-CM | POA: Diagnosis not present

## 2018-11-29 DIAGNOSIS — E785 Hyperlipidemia, unspecified: Secondary | ICD-10-CM | POA: Diagnosis not present

## 2018-11-29 DIAGNOSIS — Z853 Personal history of malignant neoplasm of breast: Secondary | ICD-10-CM | POA: Diagnosis not present

## 2019-02-18 DIAGNOSIS — I10 Essential (primary) hypertension: Secondary | ICD-10-CM | POA: Diagnosis not present

## 2019-02-18 DIAGNOSIS — I872 Venous insufficiency (chronic) (peripheral): Secondary | ICD-10-CM | POA: Diagnosis not present

## 2019-02-18 DIAGNOSIS — E1143 Type 2 diabetes mellitus with diabetic autonomic (poly)neuropathy: Secondary | ICD-10-CM | POA: Diagnosis not present

## 2019-02-18 DIAGNOSIS — K7469 Other cirrhosis of liver: Secondary | ICD-10-CM | POA: Diagnosis not present

## 2019-02-18 DIAGNOSIS — Z0000 Encounter for general adult medical examination without abnormal findings: Secondary | ICD-10-CM | POA: Diagnosis not present

## 2019-02-18 DIAGNOSIS — E038 Other specified hypothyroidism: Secondary | ICD-10-CM | POA: Diagnosis not present

## 2019-05-21 DIAGNOSIS — I10 Essential (primary) hypertension: Secondary | ICD-10-CM | POA: Diagnosis not present

## 2019-05-21 DIAGNOSIS — E1143 Type 2 diabetes mellitus with diabetic autonomic (poly)neuropathy: Secondary | ICD-10-CM | POA: Diagnosis not present

## 2019-05-21 DIAGNOSIS — E038 Other specified hypothyroidism: Secondary | ICD-10-CM | POA: Diagnosis not present

## 2019-05-21 DIAGNOSIS — K7469 Other cirrhosis of liver: Secondary | ICD-10-CM | POA: Diagnosis not present

## 2019-05-21 DIAGNOSIS — I872 Venous insufficiency (chronic) (peripheral): Secondary | ICD-10-CM | POA: Diagnosis not present

## 2019-08-25 DIAGNOSIS — E1143 Type 2 diabetes mellitus with diabetic autonomic (poly)neuropathy: Secondary | ICD-10-CM | POA: Diagnosis not present

## 2019-08-25 DIAGNOSIS — K7469 Other cirrhosis of liver: Secondary | ICD-10-CM | POA: Diagnosis not present

## 2019-08-25 DIAGNOSIS — E1121 Type 2 diabetes mellitus with diabetic nephropathy: Secondary | ICD-10-CM | POA: Diagnosis not present

## 2019-08-25 DIAGNOSIS — E038 Other specified hypothyroidism: Secondary | ICD-10-CM | POA: Diagnosis not present

## 2019-08-25 DIAGNOSIS — I10 Essential (primary) hypertension: Secondary | ICD-10-CM | POA: Diagnosis not present

## 2019-08-25 DIAGNOSIS — N182 Chronic kidney disease, stage 2 (mild): Secondary | ICD-10-CM | POA: Diagnosis not present

## 2019-09-29 DIAGNOSIS — I213 ST elevation (STEMI) myocardial infarction of unspecified site: Secondary | ICD-10-CM | POA: Diagnosis not present

## 2019-09-29 DIAGNOSIS — E161 Other hypoglycemia: Secondary | ICD-10-CM | POA: Diagnosis not present

## 2019-09-29 DIAGNOSIS — E162 Hypoglycemia, unspecified: Secondary | ICD-10-CM | POA: Diagnosis not present

## 2019-09-29 DIAGNOSIS — R55 Syncope and collapse: Secondary | ICD-10-CM | POA: Diagnosis not present

## 2019-09-29 DIAGNOSIS — Z743 Need for continuous supervision: Secondary | ICD-10-CM | POA: Diagnosis not present

## 2019-09-30 ENCOUNTER — Encounter (HOSPITAL_COMMUNITY): Payer: Self-pay

## 2019-09-30 ENCOUNTER — Other Ambulatory Visit (HOSPITAL_COMMUNITY): Payer: Medicare Other

## 2019-09-30 ENCOUNTER — Observation Stay (HOSPITAL_COMMUNITY)
Admission: EM | Admit: 2019-09-30 | Discharge: 2019-09-30 | Disposition: A | Discharge status: Home / Self Care | Payer: Medicare Other | Attending: Internal Medicine | Admitting: Internal Medicine

## 2019-09-30 ENCOUNTER — Emergency Department (HOSPITAL_COMMUNITY): Payer: Medicare Other

## 2019-09-30 DIAGNOSIS — I8510 Secondary esophageal varices without bleeding: Secondary | ICD-10-CM | POA: Diagnosis not present

## 2019-09-30 DIAGNOSIS — D649 Anemia, unspecified: Secondary | ICD-10-CM | POA: Insufficient documentation

## 2019-09-30 DIAGNOSIS — E038 Other specified hypothyroidism: Secondary | ICD-10-CM | POA: Diagnosis not present

## 2019-09-30 DIAGNOSIS — E1122 Type 2 diabetes mellitus with diabetic chronic kidney disease: Secondary | ICD-10-CM | POA: Insufficient documentation

## 2019-09-30 DIAGNOSIS — D696 Thrombocytopenia, unspecified: Secondary | ICD-10-CM | POA: Diagnosis not present

## 2019-09-30 DIAGNOSIS — D731 Hypersplenism: Secondary | ICD-10-CM | POA: Diagnosis not present

## 2019-09-30 DIAGNOSIS — K219 Gastro-esophageal reflux disease without esophagitis: Secondary | ICD-10-CM | POA: Diagnosis not present

## 2019-09-30 DIAGNOSIS — R079 Chest pain, unspecified: Secondary | ICD-10-CM | POA: Diagnosis not present

## 2019-09-30 DIAGNOSIS — Z833 Family history of diabetes mellitus: Secondary | ICD-10-CM | POA: Diagnosis not present

## 2019-09-30 DIAGNOSIS — E11649 Type 2 diabetes mellitus with hypoglycemia without coma: Secondary | ICD-10-CM | POA: Insufficient documentation

## 2019-09-30 DIAGNOSIS — Z853 Personal history of malignant neoplasm of breast: Secondary | ICD-10-CM | POA: Diagnosis not present

## 2019-09-30 DIAGNOSIS — I130 Hypertensive heart and chronic kidney disease with heart failure and stage 1 through stage 4 chronic kidney disease, or unspecified chronic kidney disease: Secondary | ICD-10-CM | POA: Diagnosis not present

## 2019-09-30 DIAGNOSIS — E7220 Disorder of urea cycle metabolism, unspecified: Secondary | ICD-10-CM | POA: Diagnosis not present

## 2019-09-30 DIAGNOSIS — Z20822 Contact with and (suspected) exposure to covid-19: Secondary | ICD-10-CM | POA: Diagnosis not present

## 2019-09-30 DIAGNOSIS — R0602 Shortness of breath: Secondary | ICD-10-CM | POA: Diagnosis not present

## 2019-09-30 DIAGNOSIS — E876 Hypokalemia: Secondary | ICD-10-CM | POA: Insufficient documentation

## 2019-09-30 DIAGNOSIS — N1832 Chronic kidney disease, stage 3b: Secondary | ICD-10-CM | POA: Insufficient documentation

## 2019-09-30 DIAGNOSIS — E119 Type 2 diabetes mellitus without complications: Secondary | ICD-10-CM

## 2019-09-30 DIAGNOSIS — E785 Hyperlipidemia, unspecified: Secondary | ICD-10-CM | POA: Insufficient documentation

## 2019-09-30 DIAGNOSIS — N183 Chronic kidney disease, stage 3 unspecified: Secondary | ICD-10-CM

## 2019-09-30 DIAGNOSIS — Z79899 Other long term (current) drug therapy: Secondary | ICD-10-CM | POA: Diagnosis not present

## 2019-09-30 DIAGNOSIS — K7460 Unspecified cirrhosis of liver: Secondary | ICD-10-CM | POA: Diagnosis present

## 2019-09-30 DIAGNOSIS — F0390 Unspecified dementia without behavioral disturbance: Secondary | ICD-10-CM | POA: Diagnosis not present

## 2019-09-30 DIAGNOSIS — Z79890 Hormone replacement therapy (postmenopausal): Secondary | ICD-10-CM | POA: Diagnosis not present

## 2019-09-30 DIAGNOSIS — I480 Paroxysmal atrial fibrillation: Secondary | ICD-10-CM | POA: Diagnosis not present

## 2019-09-30 DIAGNOSIS — Z5309 Procedure and treatment not carried out because of other contraindication: Secondary | ICD-10-CM | POA: Insufficient documentation

## 2019-09-30 DIAGNOSIS — Z794 Long term (current) use of insulin: Secondary | ICD-10-CM | POA: Diagnosis not present

## 2019-09-30 DIAGNOSIS — Z8249 Family history of ischemic heart disease and other diseases of the circulatory system: Secondary | ICD-10-CM | POA: Diagnosis not present

## 2019-09-30 DIAGNOSIS — I248 Other forms of acute ischemic heart disease: Secondary | ICD-10-CM | POA: Diagnosis not present

## 2019-09-30 DIAGNOSIS — D6959 Other secondary thrombocytopenia: Secondary | ICD-10-CM | POA: Insufficient documentation

## 2019-09-30 DIAGNOSIS — N289 Disorder of kidney and ureter, unspecified: Secondary | ICD-10-CM

## 2019-09-30 DIAGNOSIS — E039 Hypothyroidism, unspecified: Secondary | ICD-10-CM | POA: Diagnosis not present

## 2019-09-30 DIAGNOSIS — E162 Hypoglycemia, unspecified: Secondary | ICD-10-CM

## 2019-09-30 DIAGNOSIS — I4891 Unspecified atrial fibrillation: Secondary | ICD-10-CM

## 2019-09-30 DIAGNOSIS — I129 Hypertensive chronic kidney disease with stage 1 through stage 4 chronic kidney disease, or unspecified chronic kidney disease: Secondary | ICD-10-CM | POA: Insufficient documentation

## 2019-09-30 LAB — COMPREHENSIVE METABOLIC PANEL
ALT: 26 U/L (ref 0–44)
AST: 40 U/L (ref 15–41)
Albumin: 2.6 g/dL — ABNORMAL LOW (ref 3.5–5.0)
Alkaline Phosphatase: 114 U/L (ref 38–126)
Anion gap: 13 (ref 5–15)
BUN: 12 mg/dL (ref 8–23)
CO2: 17 mmol/L — ABNORMAL LOW (ref 22–32)
Calcium: 9.2 mg/dL (ref 8.9–10.3)
Chloride: 109 mmol/L (ref 98–111)
Creatinine, Ser: 1.45 mg/dL — ABNORMAL HIGH (ref 0.44–1.00)
GFR calc Af Amer: 40 mL/min — ABNORMAL LOW (ref >60)
GFR calc non Af Amer: 35 mL/min — ABNORMAL LOW (ref >60)
Glucose, Bld: 140 mg/dL — ABNORMAL HIGH (ref 70–99)
Potassium: 3.2 mmol/L — ABNORMAL LOW (ref 3.5–5.1)
Sodium: 139 mmol/L (ref 135–145)
Total Bilirubin: 1.1 mg/dL (ref 0.3–1.2)
Total Protein: 6.8 g/dL (ref 6.5–8.1)

## 2019-09-30 LAB — CBC WITH DIFFERENTIAL/PLATELET
Abs Immature Granulocytes: 0.01 10*3/uL (ref 0.00–0.07)
Basophils Absolute: 0 10*3/uL (ref 0.0–0.1)
Basophils Relative: 1 %
Eosinophils Absolute: 0.1 10*3/uL (ref 0.0–0.5)
Eosinophils Relative: 2 %
HCT: 33.3 % — ABNORMAL LOW (ref 36.0–46.0)
Hemoglobin: 10.2 g/dL — ABNORMAL LOW (ref 12.0–15.0)
Immature Granulocytes: 0 %
Lymphocytes Relative: 17 %
Lymphs Abs: 0.4 10*3/uL — ABNORMAL LOW (ref 0.7–4.0)
MCH: 27.2 pg (ref 26.0–34.0)
MCHC: 30.6 g/dL (ref 30.0–36.0)
MCV: 88.8 fL (ref 80.0–100.0)
Monocytes Absolute: 0.4 10*3/uL (ref 0.1–1.0)
Monocytes Relative: 14 %
Neutro Abs: 1.6 10*3/uL — ABNORMAL LOW (ref 1.7–7.7)
Neutrophils Relative %: 66 %
Platelets: 54 10*3/uL — ABNORMAL LOW (ref 150–400)
RBC: 3.75 MIL/uL — ABNORMAL LOW (ref 3.87–5.11)
RDW: 14.6 % (ref 11.5–15.5)
WBC: 2.5 10*3/uL — ABNORMAL LOW (ref 4.0–10.5)
nRBC: 0 % (ref 0.0–0.2)

## 2019-09-30 LAB — PROTIME-INR
INR: 1.5 — ABNORMAL HIGH (ref 0.8–1.2)
Prothrombin Time: 17.8 seconds — ABNORMAL HIGH (ref 11.4–15.2)

## 2019-09-30 LAB — HEPARIN LEVEL (UNFRACTIONATED): Heparin Unfractionated: 0.18 IU/mL — ABNORMAL LOW (ref 0.30–0.70)

## 2019-09-30 LAB — HEMOGLOBIN A1C
Hgb A1c MFr Bld: 9.6 % — ABNORMAL HIGH (ref 4.8–5.6)
Mean Plasma Glucose: 228.82 mg/dL

## 2019-09-30 LAB — TROPONIN I (HIGH SENSITIVITY)
Troponin I (High Sensitivity): 11 ng/L (ref <18)
Troponin I (High Sensitivity): 32 ng/L — ABNORMAL HIGH (ref <18)
Troponin I (High Sensitivity): 41 ng/L — ABNORMAL HIGH (ref <18)
Troponin I (High Sensitivity): 33 ng/L — ABNORMAL HIGH (ref <18)

## 2019-09-30 LAB — CBG MONITORING, ED
Glucose-Capillary: 104 mg/dL — ABNORMAL HIGH (ref 70–99)
Glucose-Capillary: 128 mg/dL — ABNORMAL HIGH (ref 70–99)
Glucose-Capillary: 157 mg/dL — ABNORMAL HIGH (ref 70–99)

## 2019-09-30 LAB — TSH: TSH: 10.663 u[IU]/mL — ABNORMAL HIGH (ref 0.350–4.500)

## 2019-09-30 LAB — SARS CORONAVIRUS 2 (TAT 6-24 HRS): SARS Coronavirus 2: NEGATIVE

## 2019-09-30 LAB — T4, FREE: Free T4: 1.08 ng/dL (ref 0.61–1.12)

## 2019-09-30 LAB — AMMONIA: Ammonia: 62 umol/L — ABNORMAL HIGH (ref 9–35)

## 2019-09-30 MED ORDER — HEPARIN BOLUS VIA INFUSION
3000.0000 [IU] | Freq: Once | INTRAVENOUS | Status: AC
  Administered 2019-09-30: 3000 [IU] via INTRAVENOUS
  Filled 2019-09-30: qty 3000

## 2019-09-30 MED ORDER — PROPRANOLOL HCL 10 MG PO TABS
10.0000 mg | ORAL_TABLET | Freq: Two times a day (BID) | ORAL | Status: DC
  Filled 2019-09-30 (×2): qty 1

## 2019-09-30 MED ORDER — ACETAMINOPHEN 325 MG PO TABS: 650.0000 mg | ORAL_TABLET | ORAL | Status: DC | PRN

## 2019-09-30 MED ORDER — ONDANSETRON HCL 4 MG/2ML IJ SOLN
4.0000 mg | Freq: Once | INTRAMUSCULAR | Status: AC
  Administered 2019-09-30: 01:00:00 4 mg via INTRAVENOUS
  Filled 2019-09-30: qty 2

## 2019-09-30 MED ORDER — ASPIRIN 81 MG PO CHEW
324.0000 mg | CHEWABLE_TABLET | Freq: Once | ORAL | Status: AC
Start: 2019-09-30 — End: 2019-09-30
  Administered 2019-09-30: 01:00:00 324 mg via ORAL
  Filled 2019-09-30: qty 4

## 2019-09-30 MED ORDER — INSULIN ASPART 100 UNIT/ML ~~LOC~~ SOLN
0.0000 [IU] | SUBCUTANEOUS | Status: DC
  Administered 2019-09-30: 2 [IU] via SUBCUTANEOUS

## 2019-09-30 MED ORDER — LEVOTHYROXINE SODIUM 100 MCG PO TABS
100.0000 ug | ORAL_TABLET | Freq: Every day | ORAL | Status: DC
  Administered 2019-09-30: 100 ug via ORAL
  Filled 2019-09-30: qty 1

## 2019-09-30 MED ORDER — POTASSIUM CHLORIDE CRYS ER 20 MEQ PO TBCR
40.0000 meq | EXTENDED_RELEASE_TABLET | Freq: Once | ORAL | Status: AC
  Administered 2019-09-30: 40 meq via ORAL
  Filled 2019-09-30: qty 2

## 2019-09-30 MED ORDER — MORPHINE SULFATE (PF) 4 MG/ML IV SOLN
4.0000 mg | Freq: Once | INTRAVENOUS | Status: AC
  Administered 2019-09-30: 05:00:00 4 mg via INTRAVENOUS
  Filled 2019-09-30 (×2): qty 1

## 2019-09-30 MED ORDER — ONDANSETRON HCL 4 MG/2ML IJ SOLN: 4.0000 mg | Freq: Four times a day (QID) | INTRAMUSCULAR | Status: DC | PRN

## 2019-09-30 MED ORDER — LACTULOSE 10 GM/15ML PO SOLN
10.0000 g | Freq: Two times a day (BID) | ORAL | Status: DC
  Administered 2019-09-30: 10 g via ORAL
  Filled 2019-09-30 (×2): qty 15

## 2019-09-30 MED ORDER — METOPROLOL TARTRATE 5 MG/5ML IV SOLN
2.5000 mg | Freq: Once | INTRAVENOUS | Status: AC
  Administered 2019-09-30: 01:00:00 2.5 mg via INTRAVENOUS
  Filled 2019-09-30: qty 5

## 2019-09-30 MED ORDER — HEPARIN (PORCINE) 25000 UT/250ML-% IV SOLN
1100.0000 [IU]/h | INTRAVENOUS | Status: DC
  Administered 2019-09-30: 03:00:00 900 [IU]/h via INTRAVENOUS
  Filled 2019-09-30: qty 250

## 2019-09-30 NOTE — Discharge Summary (Addendum)
Physician Discharge Summary  Melissa Rose [id number] DOB: 07-05-1941 DOA: 09/30/2019  PCP: Neale Burly, MD  Admit date: 09/30/2019 Discharge date: 09/30/2019  Admitted From: Home Disposition:  Home  Recommendations for Outpatient Follow-up:  1. Follow up with PCP in 1 week 2. Follow up with Cardiology 3. Repeat TSH, Free T4 as outpatient  4. Follow up outpatient echocardiogram  5. Follow up BMP due to CKD and hypokalemia   Discharge Condition: Stable CODE STATUS: Full Diet recommendation: Heart healthy   Brief/Interim Summary: Melissa Rose is a 79 year old female with past medical history significant for cirrhosis, esophageal varices, type 2 diabetes, dementia, hypertension.  Patient was brought in by EMS due to complaints of chest pain and leg pain.  She was also found to have new onset A. fib on the monitor.  Had hypoglycemic episode that resolved with D50.  Initially, came in as code STEMI although this was canceled.  A. fib RVR rate 120s, converted to normal sinus rhythm.  She was started on heparin drip and cardiology consulted. She remained chest pain free and cardiology recommended outpatient follow up. They advised against systemic anticoagulation due to her risk of bleeding.   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   Discharge Instructions  Discharge Instructions    Call MD for:  difficulty breathing, headache or visual disturbances   Complete by: As directed    Call MD for:  extreme fatigue   Complete by: As directed    Call MD for:  persistant dizziness or light-headedness   Complete by: As directed    Call MD for:  persistant nausea and vomiting   Complete by: As directed    Call  MD for:  severe uncontrolled pain   Complete by: As directed    Call MD for:  temperature >100.4   Complete by: As directed    Diet - low sodium heart healthy   Complete by: As directed    Discharge instructions   Complete by: As directed    You were cared for by a hospitalist during your hospital stay. If you have any questions about your discharge medications or the care you received while you were in the hospital after you are discharged, you can call the unit and ask to speak with the hospitalist on call if the hospitalist that took care of you is not available. Once you are discharged, your primary care physician will handle any further medical issues. Please note that NO REFILLS for any discharge medications will be authorized once you are discharged, as it is imperative that you return to your primary care physician (or establish a relationship with a primary care physician if you do not have one) for your aftercare needs so that they can reassess your need for medications and monitor your lab values.   Increase activity slowly   Complete by: As directed      Allergies as of 09/30/2019   No Known Allergies     Medication List    STOP taking these medications   metFORMIN 1000 MG tablet Commonly known as: GLUCOPHAGE     TAKE these medications   atorvastatin 10 MG tablet Commonly known as: LIPITOR Take 10 mg by mouth at bedtime.  Calcium 600/Vitamin D3 600-800 MG-UNIT Tabs Generic drug: Calcium Carb-Cholecalciferol Take 1 tablet by mouth daily.   donepezil 10 MG tablet Commonly known as: ARICEPT Take 10 mg by mouth every evening.   furosemide 20 MG tablet Commonly known as: LASIX Take 20 mg by mouth daily.   glipiZIDE 5 MG tablet Commonly known as: GLUCOTROL Take 5 mg by mouth 2 (two) times daily.   lactulose 10 GM/15ML solution Commonly known as: CHRONULAC Take 10 g by mouth 2 (two) times daily.   levothyroxine 100 MCG tablet Commonly known as: SYNTHROID Take  100 mcg by mouth daily.   lisinopril 10 MG tablet Commonly known as: ZESTRIL Take 10 mg by mouth daily.   mupirocin nasal ointment 2 % Commonly known as: BACTROBAN Place 1 application into the nose 2 (two) times daily. Use one-half of tube in each nostril twice daily for five (5) days. After application, press sides of nose together and gently massage.   NovoLOG FlexPen 100 UNIT/ML FlexPen Generic drug: insulin aspart Inject 35 Units into the skin 2 (two) times daily with a meal.   propranolol 10 MG tablet Commonly known as: INDERAL Take 1 tablet (10 mg total) by mouth 2 (two) times daily.   ranitidine 300 MG tablet Commonly known as: ZANTAC Take 300 mg by mouth 2 (two) times daily.   spironolactone 25 MG tablet Commonly known as: ALDACTONE Take 12.5 mg by mouth 2 (two) times daily.      Follow-up Information    Richardson Dopp T, PA-C Follow up on 10/14/2019.   Specialties: Cardiology, Physician Assistant Why: Please go to hospital follow-up January 20th at 1:45 PM Contact information: [id number] [address] [place] [address] [phone]        Neale Burly, MD. Schedule an appointment as soon as possible for a visit in 1 week(s).   Specialty: Internal Medicine Contact information: [address] [place] [id number] [phone]          No Known Allergies  Consultations:  Cardiology    Procedures/Studies: DG Chest Port 1 View  Result Date: 09/30/2019 CLINICAL DATA:  Chest pain, shortness of breath EXAM: PORTABLE CHEST 1 VIEW COMPARISON:  Radiograph 07/06/2014, CT 03/01/2017 FINDINGS: Chronically coarsened interstitial changes with some bandlike opacities favoring atelectasis in the setting of low lung volumes. No consolidative opacity. No pneumothorax or effusion. Cardiac pacer pad telemetry leads overlie the chest wall. Cardiomediastinal silhouette is similar to prior. Postsurgical changes of right axilla and breast. No acute osseous or  soft tissue abnormality. Degenerative changes are present in the imaged spine and shoulders. Mild dextrocurvature of the midthoracic spine. IMPRESSION: 1. Low lung volumes with bandlike opacities favoring atelectasis in the setting of low lung volumes. Electronically Signed   By: Lovena Le M.D.   On: 09/30/2019 01:01    Discharge Exam: Vitals:   09/30/19 1015 09/30/19 1115  BP: (!) 109/51 (!) 110/50  Pulse: (!) 58 (!) 56  Resp: 14 12  Temp:    SpO2: 97% 97%    General: Pt is alert, awake, not in acute distress Cardiovascular: RRR, S1/S2 +, no edema Respiratory: CTA bilaterally, no wheezing, no rhonchi, no respiratory distress, no conversational dyspnea  Abdominal: Soft, NT, ND, bowel sounds + Extremities: no edema, no cyanosis Psych: Normal mood and affect, stable judgement and insight     The results of significant diagnostics from this hospitalization (including imaging, microbiology, ancillary and laboratory) are listed below for reference.  Microbiology: Recent Results (from the past 240 hour(s))  SARS CORONAVIRUS 2 (TAT 6-24 HRS) Nasopharyngeal Nasopharyngeal Swab     Status: None   Collection Time: 09/30/19 12:59 AM   Specimen: Nasopharyngeal Swab  Result Value Ref Range Status   SARS Coronavirus 2 NEGATIVE NEGATIVE Final    Comment: (NOTE) SARS-CoV-2 target nucleic acids are NOT DETECTED. The SARS-CoV-2 RNA is generally detectable in upper and lower respiratory specimens during the acute phase of infection. Negative results do not preclude SARS-CoV-2 infection, do not rule out co-infections with other pathogens, and should not be used as the sole basis for treatment or other patient management decisions. Negative results must be combined with clinical observations, patient history, and epidemiological information. The expected result is Negative. Fact Sheet for Patients: SugarRoll.be Fact Sheet for Healthcare  Providers: https://www.woods-mathews.com/ This test is not yet approved or cleared by the [place] FDA and  has been authorized for detection and/or diagnosis of SARS-CoV-2 by FDA under an Emergency Use Authorization (EUA). This EUA will remain  in effect (meaning this test can be used) for the duration of the COVID-19 declaration under Section 56 4(b)(1) of the Act, 21 U.S.C. section 360bbb-3(b)(1), unless the authorization is terminated or revoked sooner. Performed at [facility], [place] [address] [place], [place] [address]      Labs: BNP (last 3 results) No results for input(s): BNP in the last 8760 hours. Basic Metabolic Panel: Recent Labs  Lab 09/30/19 0026  NA 139  K 3.2*  CL 109  CO2 17*  GLUCOSE 140*  BUN 12  CREATININE 1.45*  CALCIUM 9.2   Liver Function Tests: Recent Labs  Lab 09/30/19 0026  AST 40  ALT 26  ALKPHOS 114  BILITOT 1.1  PROT 6.8  ALBUMIN 2.6*   No results for input(s): LIPASE, AMYLASE in the last 168 hours. Recent Labs  Lab 09/30/19 0056  AMMONIA 62*   CBC: Recent Labs  Lab 09/30/19 0026  WBC 2.5*  NEUTROABS 1.6*  HGB 10.2*  HCT 33.3*  MCV 88.8  PLT 54*   Cardiac Enzymes: No results for input(s): CKTOTAL, CKMB, CKMBINDEX, TROPONINI in the last 168 hours. BNP: Invalid input(s): POCBNP CBG: Recent Labs  Lab 09/30/19 0033 09/30/19 0746 09/30/19 1226  GLUCAP 128* 157* 104*   D-Dimer No results for input(s): DDIMER in the last 72 hours. Hgb A1c Recent Labs    09/30/19 0829  HGBA1C 9.6*   Lipid Profile No results for input(s): CHOL, HDL, LDLCALC, TRIG, CHOLHDL, LDLDIRECT in the last 72 hours. Thyroid function studies Recent Labs    09/30/19 0056  TSH 10.663*   Anemia work up No results for input(s): VITAMINB12, FOLATE, FERRITIN, TIBC, IRON, RETICCTPCT in the last 72 hours. Urinalysis No results found for: COLORURINE, APPEARANCEUR, [place], [place], [place], [place], [place], [place],  PROTEINUR, UROBILINOGEN, NITRITE, LEUKOCYTESUR Sepsis Labs Invalid input(s): PROCALCITONIN,  WBC,  LACTICIDVEN Microbiology Recent Results (from the past 240 hour(s))  SARS CORONAVIRUS 2 (TAT 6-24 HRS) Nasopharyngeal Nasopharyngeal Swab     Status: None   Collection Time: 09/30/19 12:59 AM   Specimen: Nasopharyngeal Swab  Result Value Ref Range Status   SARS Coronavirus 2 NEGATIVE NEGATIVE Final    Comment: (NOTE) SARS-CoV-2 target nucleic acids are NOT DETECTED. The SARS-CoV-2 RNA is generally detectable in upper and lower respiratory specimens during the acute phase of infection. Negative results do not preclude SARS-CoV-2 infection, do not rule out co-infections with other pathogens, and should not be used as the sole  basis for treatment or other patient management decisions. Negative results must be combined with clinical observations, patient history, and epidemiological information. The expected result is Negative. Fact Sheet for Patients: SugarRoll.be Fact Sheet for Healthcare Providers: https://www.woods-mathews.com/ This test is not yet approved or cleared by the [place] FDA and  has been authorized for detection and/or diagnosis of SARS-CoV-2 by FDA under an Emergency Use Authorization (EUA). This EUA will remain  in effect (meaning this test can be used) for the duration of the COVID-19 declaration under Section 56 4(b)(1) of the Act, 21 U.S.C. section 360bbb-3(b)(1), unless the authorization is terminated or revoked sooner. Performed at [facility], [place] [address], [place], [place] [address]      Patient was seen and examined on the day of discharge and was found to be in stable condition. Time coordinating discharge: 40 minutes including assessment and coordination of care, as well as examination of the patient.   SIGNED:  Dessa Phi, DO [facility] 09/30/2019, 2:55 PM

## 2019-09-30 NOTE — ED Notes (Signed)
Call daughter Tereasa Coop at [phone] for an update

## 2019-09-30 NOTE — Progress Notes (Signed)
ANTICOAGULATION CONSULT NOTE  Pharmacy Consult for Heparin Indication: atrial fibrillation  No Known Allergies  Patient Measurements: Weight: 150 lb (68 kg)  Vital Signs: Temp: 96.7 F (35.9 C) (01/06 0020) Temp Source: Tympanic (01/06 0020) BP: 110/50 (01/06 1115) Pulse Rate: 56 (01/06 1115)  Labs: Recent Labs    09/30/19 0026 09/30/19 0346 09/30/19 0829 09/30/19 1105  HGB 10.2*  --   --   --   HCT 33.3*  --   --   --   PLT 54*  --   --   --   LABPROT 17.8*  --   --   --   INR 1.5*  --   --   --   HEPARINUNFRC  --   --   --  0.18*  CREATININE 1.45*  --   --   --   TROPONINIHS 11 32* 41*  --     CrCl cannot be calculated (Unknown ideal weight.).  Assessment: 79 y.o. female continues on IV heparin for afib. Heparin level is subtherapeutic. No bleeding noted. Platelets are chronically low. She is not on anticoagulation PTA.   Goal of Therapy:  Heparin level 0.3-0.7 units/ml Monitor platelets by anticoagulation protocol: Yes   Plan:  Increase heparin gtt 1100 units/hr Check an 8 hr heparin level Daily heparin level and CBC F/u [facility] plan  Salome Arnt, PharmD, BCPS Clinical Pharmacist Please see AMION for all pharmacy numbers 09/30/2019 12:15 PM
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
## 2019-09-30 NOTE — ED Notes (Signed)
Dr. Gardner at bedside 

## 2019-09-30 NOTE — H&P (Signed)
History and Physical    Melissa Rose [id number] DOB: 06/08/1941 DOA: 09/30/2019  PCP: Neale Burly, MD  Patient coming from: Home  I have personally briefly reviewed patient's old medical records in [place]  Chief Complaint: CP  HPI: Melissa Rose is a 79 y.o. female with medical history significant of cirrhosis, esophageal varices, DM2, dementia, HTN.  Patient brought in by EMS due to c/o chest pain and leg pain.  A.Fib on monitor which is new for patient, no prior history of this.  Had episode of unresponsiveness with CBG of 28 that resolved with D50.   ED Course: Came in as code STEMI though this was canceled.  Initially A.Fib with rate in 120s, converted to NSR and CP resolved after.  Trops 11 and 32.  Ammonia 62, TSH 10.6.  Platelets 54, creat 1.45 (chronic)  INR 1.5.  Started on heparin gtt in ED.  Now she is just having back and leg pain which she seems to indicate is more of a chronic issue and occurs when ever she "is laying in bed for a prolonged period" of time.   Review of Systems: As per HPI, otherwise all review of systems negative.  Past Medical History:  Diagnosis Date  . Arthritis   . Breast cancer ([facility]) 2000   treated with chemo/radiation  . Dementia ([place])    slight  . Diabetes mellitus without complication ([place])   . GERD (gastroesophageal reflux disease)   . Hyperlipidemia   . Hypertension   . Hypothyroidism     Past Surgical History:  Procedure Laterality Date  . BREAST SURGERY Right   . 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 COLONOSCOPY  11/2016   Dr. Ladona Horns: normal  . ESOPHAGEAL BANDING N/A 02/13/2017   Procedure: ESOPHAGEAL BANDING;  Surgeon: Daneil Dolin, MD;  [facility];  Service: Endoscopy;  Laterality: N/A;  . ESOPHAGEAL BANDING N/A 03/25/2017   Procedure: ESOPHAGEAL BANDING;  Surgeon: Daneil Dolin, MD;  [facility];   Service: Endoscopy;  Laterality: N/A;  Esophageal varices banding  . ESOPHAGEAL BANDING N/A 06/03/2017   Procedure: ESOPHAGEAL BANDING;  Surgeon: Daneil Dolin, MD;  [facility];  Service: Endoscopy;  Laterality: N/A;  varices  . ESOPHAGOGASTRODUODENOSCOPY  11/2016   Dr. 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  . ESOPHAGOGASTRODUODENOSCOPY (EGD) WITH PROPOFOL N/A 02/13/2017   Procedure: ESOPHAGOGASTRODUODENOSCOPY (EGD) WITH PROPOFOL;  Surgeon: Daneil Dolin, MD;  [facility];  Service: Endoscopy;  Laterality: N/A;  145   . ESOPHAGOGASTRODUODENOSCOPY (EGD) WITH PROPOFOL N/A 03/25/2017   Procedure: ESOPHAGOGASTRODUODENOSCOPY (EGD) WITH PROPOFOL;  Surgeon: Daneil Dolin, MD;  [facility];  Service: Endoscopy;  Laterality: N/A;  7:30am  . ESOPHAGOGASTRODUODENOSCOPY (EGD) WITH PROPOFOL N/A 06/03/2017   Procedure: ESOPHAGOGASTRODUODENOSCOPY (EGD) WITH PROPOFOL;  Surgeon: Daneil Dolin, MD;  [facility];  Service: Endoscopy;  Laterality: N/A;  8:30am     reports that she has never smoked. She has never used smokeless tobacco. She reports that she does not drink alcohol or use drugs.  No Known Allergies  Family History  Problem Relation Age of Onset  . Diabetes Mother   . Diabetes Father   . Heart disease Father   . Cerebral palsy Sister   . Liver disease Neg  Hx   . Colon cancer Neg Hx      Prior to Admission medications   Medication Sig Start Date End Date Taking? Authorizing Provider  atorvastatin (LIPITOR) 10 MG tablet Take 10 mg by mouth at bedtime.  11/02/16   [provider]  Calcium Carb-Cholecalciferol (CALCIUM 600/VITAMIN D3) 600-800 MG-UNIT TABS Take 1 tablet by mouth daily.    [provider]  donepezil (ARICEPT) 10 MG tablet Take 10 mg by mouth every evening.  11/02/16   [provider]  furosemide  (LASIX) 20 MG tablet Take 20 mg by mouth daily.    [provider]  glipiZIDE (GLUCOTROL) 5 MG tablet Take 5 mg by mouth 2 (two) times daily. 04/27/14   [provider]  insulin aspart (NOVOLOG FLEXPEN) 100 UNIT/ML FlexPen Inject 35 Units into the skin 2 (two) times daily with a meal.    [provider]  lactulose (CHRONULAC) 10 GM/15ML solution Take 10 g by mouth 2 (two) times daily.    [provider]  levothyroxine (SYNTHROID, LEVOTHROID) 100 MCG tablet Take 100 mcg by mouth daily.  11/02/16   [provider]  lisinopril (PRINIVIL,ZESTRIL) 10 MG tablet Take 10 mg by mouth daily. 04/27/14   [provider]  metFORMIN (GLUCOPHAGE) 1000 MG tablet Take 1,000 mg by mouth 2 (two) times daily.    [provider]  mupirocin nasal ointment (BACTROBAN) 2 % Place 1 application into the nose 2 (two) times daily. Use one-half of tube in each nostril twice daily for five (5) days. After application, press sides of nose together and gently massage.    [provider]  propranolol (INDERAL) 10 MG tablet Take 1 tablet (10 mg total) by mouth 2 (two) times daily. 08/28/17   Carlis Stable, NP  ranitidine (ZANTAC) 300 MG tablet Take 300 mg by mouth 2 (two) times daily.  11/02/16   [provider]  spironolactone (ALDACTONE) 25 MG tablet Take 12.5 mg by mouth 2 (two) times daily.    [provider]    Physical Exam: Vitals:   09/30/19 0415 09/30/19 0430 09/30/19 0445 09/30/19 0515  BP: 115/78 111/74 (!) 113/56 (!) 102/56  Pulse: 100 (!) 103 68 66  Resp: (!) 21 14 17 13   Temp:      TempSrc:      SpO2: 98% 99% 98% 99%  Weight:        Constitutional: Pt complaining of back and leg pain. Eyes: PERRL, lids and conjunctivae normal ENMT: Mucous membranes are moist. Posterior pharynx clear of any exudate or lesions.Normal dentition.  Neck: normal, supple, no masses, no thyromegaly Respiratory: clear to auscultation bilaterally, no  wheezing, no crackles. Normal respiratory effort. No accessory muscle use.  Cardiovascular: Regular rate and rhythm, no murmurs / rubs / gallops. No extremity edema. 2+ pedal pulses. No carotid bruits.  Abdomen: no tenderness, no masses palpated. No hepatosplenomegaly. Bowel sounds positive.  Musculoskeletal: no clubbing / cyanosis. No joint deformity upper and lower extremities. Good ROM, no contractures. Normal muscle tone.  Skin: no rashes, lesions, ulcers. No induration Neurologic: CN 2-12 grossly intact. Sensation intact, DTR normal. Strength 5/5 in all 4.  Psychiatric: Mild dementia noted.   Labs on Admission: I have personally reviewed following labs and imaging studies  CBC: Recent Labs  Lab 09/30/19 0026  WBC 2.5*  NEUTROABS 1.6*  HGB 10.2*  HCT 33.3*  MCV 88.8  PLT 54*   Basic Metabolic Panel: Recent Labs  Lab 09/30/19 0026  NA 139  K 3.2*  CL 109  CO2 17*  GLUCOSE 140*  BUN 12  CREATININE 1.45*  CALCIUM 9.2   GFR: CrCl cannot be calculated (Unknown ideal weight.). Liver Function Tests: Recent Labs  Lab 09/30/19 0026  AST 40  ALT 26  ALKPHOS 114  BILITOT 1.1  PROT 6.8  ALBUMIN 2.6*   No results for input(s): LIPASE, AMYLASE in the last 168 hours. Recent Labs  Lab 09/30/19 0056  AMMONIA 62*   Coagulation Profile: Recent Labs  Lab 09/30/19 0026  INR 1.5*   Cardiac Enzymes: No results for input(s): CKTOTAL, CKMB, CKMBINDEX, TROPONINI in the last 168 hours. BNP (last 3 results) No results for input(s): PROBNP in the last 8760 hours. HbA1C: No results for input(s): HGBA1C in the last 72 hours. CBG: Recent Labs  Lab 09/30/19 0033  GLUCAP 128*   Lipid Profile: No results for input(s): CHOL, HDL, LDLCALC, TRIG, CHOLHDL, LDLDIRECT in the last 72 hours. Thyroid Function Tests: Recent Labs    09/30/19 0026 09/30/19 0056  TSH  --  10.663*  FREET4 1.08  --    Anemia Panel: No results for input(s): VITAMINB12, FOLATE, FERRITIN, TIBC,  IRON, RETICCTPCT in the last 72 hours. Urine analysis: No results found for: COLORURINE, APPEARANCEUR, [place], [place], GLUCOSEU, [place], [place], [place], [place], [place], NITRITE, LEUKOCYTESUR  Radiological Exams on Admission: DG Chest Port 1 View  Result Date: 09/30/2019 CLINICAL DATA:  Chest pain, shortness of breath EXAM: PORTABLE CHEST 1 VIEW COMPARISON:  Radiograph 07/06/2014, CT 03/01/2017 FINDINGS: Chronically coarsened interstitial changes with some bandlike opacities favoring atelectasis in the setting of low lung volumes. No consolidative opacity. No pneumothorax or effusion. Cardiac pacer pad telemetry leads overlie the chest wall. Cardiomediastinal silhouette is similar to prior. Postsurgical changes of right axilla and breast. No acute osseous or soft tissue abnormality. Degenerative changes are present in the imaged spine and shoulders. Mild dextrocurvature of the midthoracic spine. IMPRESSION: 1. Low lung volumes with bandlike opacities favoring atelectasis in the setting of low lung volumes. Electronically Signed   By: Lovena Le M.D.   On: 09/30/2019 01:01    EKG: Independently reviewed.  Assessment/Plan Principal Problem:   Chest pain, rule out acute myocardial infarction Active Problems:   Hepatic cirrhosis (H[facility])   Diabetes ([facility])   Thrombocytopenia due to hypersplenism   Hypothyroidism   New onset a-fib ([facility])    1. CP rule out - 1. Demand ischemia due to A.Fib most likely 2. CP obs pathway 3. Serial trops 4. Tele monitor 5. NPO 6. Cards eval in AM 2. New onset A.Fib - 1. Converted to NSR after metoprolol in ED 2. Will continue propranolol that it looks like she takes presumably for esophageal varicies (assuming this hasnt changed since 2018). 3. CHADS vasc of 5 4. Will leave the heparin gtt alone for the moment, with chronic thrombocytopenia, INR 1.5, and h/o esophageal varicies, not sure about chronic anticoagulation though.  Will defer to  cardiology here. 5. Tele monitor 3. Compensated hepatic cirrhosis - 1. Until med rec completed, will continue the lactulose dose from 2018 2. Though what ever she is currently taking, may need to be increased given ammonia of 61 today 4. Hypothyroidism - 1. Until med rec completed, will put her on the 162mcg dose she was on in 2018 2. Again, what ever dose she is currently taking may need to be increased slightly due to TSH of 10 today. 5. DM2 - 1. Holding home meds due to NPO status  and hypoglycemia in ambulance 2. Will put on sensitive SSI Q4H for the moment 6. Thrombocytopenia - chronic due to cirrhosis and hypersplenism  DVT prophylaxis: Heparin gtt Code Status: Full Family Communication: Husband at bedside Disposition Plan: Home after admit Consults called: None Admission status: Place in 42   [place], [place] Hospitalists  How to contact the [facility] Attending or Consulting provider [place] or covering provider during after hours [place], for this patient?  1. Check the care team in [facility] and look for a) attending/consulting [facility] provider listed and b) the [facility] team listed 2. Log into [email or web address]  Amion Physician Scheduling and messaging for groups and whole hospitals  On call and physician scheduling software for group practices, residents, hospitalists and other medical providers for call, clinic, rotation and shift schedules. On[employer] is a hospital-wide system for scheduling doctors and paging doctors on call. EasyPlot is for scientific plotting and data analysis.  [email or web address]  and use [facility]'s universal password to access. If you do not have the password, please contact the hospital operator.  3. Locate the [facility] provider you are looking for under [facility] and page to a number that you can be directly reached. 4. If you still have difficulty reaching the provider, please page the [facility] (Director on Call) for the Hospitalists listed on amion for  assistance.  09/30/2019, 5:37 AM

## 2019-09-30 NOTE — ED Notes (Signed)
Checked patient cbg it was 88 notified RN Gabe of blood sugar

## 2019-09-30 NOTE — Progress Notes (Addendum)
Cardiology Consultation:   Patient ID: Melissa Rose MRN: [id number]; DOB: [birth year]  Admit date: 09/30/2019 Date of Consult: 09/30/2019  Primary Care Provider: Neale Burly, MD Primary Cardiologist: No primary care provider on file.  Primary Electrophysiologist:  None    Patient Profile:   Melissa Rose is a 79 y.o. female with a hx of breast cancer (10 years ago), cirrhosis, esophageal varices, DM2, HTN, HLD who is being seen today for the evaluation of chest pain and new onset atrial fibrillation at the request of Dr. Maylene Roes.  History of Present Illness:   Melissa Rose has not been seen by cardiology in the past. Patient is accompanied by her husband. Both are poor historians. Deny known history of MI, stent, heart failure, and stroke. Family history is positive for DM, no cardiac h/o. No tobacco/alcohol/drug use. Live together and able to care for each other.  The patient presented to the ED 09/29/18 for ?chest pain and leg pain. On my interview the patient did not remember if she had chest pain. She does not remember events of what happened or how she felt. The husband reported that he was watching TV and she was in her bed. The patient does think she felt more tired than usual. She did not remember having chest pain or SOB. The husband went into her room and asked if anything was wrong and he reported she was making noise but no coherent words were coming out. They have a neighbor who visits every day and she came over and saw something was off with the patient and called EMS. No nausea or diaphoresis. Husband says she has been vomiting (non-bloody) once a day. No lower leg edema or orthopnea. No recent illness, fever, chills. On the way EMS activated code STEMI which was canceled on arrival. They also reported BG 26. She was noted to have afib RVR.  In the ED BP 101/77, pulse 102, afebrile, RR 19, 97% O2. Labs showed potassium 3.2, CO2 17, glucose 140, creatinine 1.45. albumin 2.6. WBC 2.5,  Hgb 10.2. INR 1.5. TSH 10.663 T$ 1.08. Marland Kitchen Hs troponin 11 > 32 > 41. Hgb A1C 9.6. COVID negative. Ammonia 62. CXR showed atelectasis int he setting of low lung volumes. EKG showed Afib RVR, 112 bpm with minimal ST depression in V3,V4, V5, II, and repolarization abnormality. She was given D50 for low BG with improvement. Patient was started on heparin drip. She was given metoprolol with conversion to NSR.  Heart Pathway Score:     Past Medical History:  Diagnosis Date  . Arthritis   . Breast cancer ([place]) 2000   treated with chemo/radiation  . Dementia ([place])    slight  . Diabetes mellitus without complication ([place])   . GERD (gastroesophageal reflux disease)   . Hyperlipidemia   . Hypertension   . Hypothyroidism     Past Surgical History:  Procedure Laterality Date  . BREAST SURGERY Right   . 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 COLONOSCOPY  11/2016   Dr. Ladona Horns: normal  . ESOPHAGEAL BANDING N/A 02/13/2017   Procedure: ESOPHAGEAL BANDING;  Surgeon: Daneil Dolin, MD;  [facility];  Service: Endoscopy;  Laterality: N/A;  . ESOPHAGEAL BANDING N/A 03/25/2017   Procedure: ESOPHAGEAL BANDING;  Surgeon: Daneil Dolin, MD;  [facility];  Service: Endoscopy;  Laterality: N/A;  Esophageal varices banding  . ESOPHAGEAL BANDING N/A 06/03/2017   Procedure:  ESOPHAGEAL BANDING;  Surgeon: Daneil Dolin, MD;  [facility];  Service: Endoscopy;  Laterality: N/A;  varices  . ESOPHAGOGASTRODUODENOSCOPY  11/2016   Dr. 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  . ESOPHAGOGASTRODUODENOSCOPY (EGD) WITH PROPOFOL N/A 02/13/2017   Procedure: ESOPHAGOGASTRODUODENOSCOPY (EGD) WITH PROPOFOL;  Surgeon: Daneil Dolin, MD;  [facility];  Service: Endoscopy;  Laterality: N/A;  145   .  ESOPHAGOGASTRODUODENOSCOPY (EGD) WITH PROPOFOL N/A 03/25/2017   Procedure: ESOPHAGOGASTRODUODENOSCOPY (EGD) WITH PROPOFOL;  Surgeon: Daneil Dolin, MD;  [facility];  Service: Endoscopy;  Laterality: N/A;  7:30am  . ESOPHAGOGASTRODUODENOSCOPY (EGD) WITH PROPOFOL N/A 06/03/2017   Procedure: ESOPHAGOGASTRODUODENOSCOPY (EGD) WITH PROPOFOL;  Surgeon: Daneil Dolin, MD;  [facility];  Service: Endoscopy;  Laterality: N/A;  8:30am     Home Medications:  Prior to Admission medications   Medication Sig Start Date End Date Taking? Authorizing Provider  atorvastatin (LIPITOR) 10 MG tablet Take 10 mg by mouth at bedtime.  11/02/16   [provider]  Calcium Carb-Cholecalciferol (CALCIUM 600/VITAMIN D3) 600-800 MG-UNIT TABS Take 1 tablet by mouth daily.    [provider]  donepezil (ARICEPT) 10 MG tablet Take 10 mg by mouth every evening.  11/02/16   [provider]  furosemide (LASIX) 20 MG tablet Take 20 mg by mouth daily.    [provider]  glipiZIDE (GLUCOTROL) 5 MG tablet Take 5 mg by mouth 2 (two) times daily. 04/27/14   [provider]  insulin aspart (NOVOLOG FLEXPEN) 100 UNIT/ML FlexPen Inject 35 Units into the skin 2 (two) times daily with a meal.    [provider]  lactulose (CHRONULAC) 10 GM/15ML solution Take 10 g by mouth 2 (two) times daily.    [provider]  levothyroxine (SYNTHROID, LEVOTHROID) 100 MCG tablet Take 100 mcg by mouth daily.  11/02/16   [provider]  lisinopril (PRINIVIL,ZESTRIL) 10 MG tablet Take 10 mg by mouth daily. 04/27/14   [provider]  metFORMIN (GLUCOPHAGE) 1000 MG tablet Take 1,000 mg by mouth 2 (two) times daily.    [provider]  mupirocin nasal ointment (BACTROBAN) 2 % Place 1 application into the nose 2 (two) times daily. Use one-half of tube in each nostril twice daily for five (5) days. After application, press sides of nose together and gently  massage.    [provider]  propranolol (INDERAL) 10 MG tablet Take 1 tablet (10 mg total) by mouth 2 (two) times daily. 08/28/17   Carlis Stable, NP  ranitidine (ZANTAC) 300 MG tablet Take 300 mg by mouth 2 (two) times daily.  11/02/16   [provider]  spironolactone (ALDACTONE) 25 MG tablet Take 12.5 mg by mouth 2 (two) times daily.    [provider]    Inpatient Medications: Scheduled Meds: . insulin aspart  0-9 Units Subcutaneous Q4H  . lactulose  10 g Oral BID  . levothyroxine  100 mcg Oral Daily  . propranolol  10 mg Oral BID   Continuous Infusions: . heparin 900 Units/hr (09/30/19 0305)   PRN Meds: acetaminophen, ondansetron (ZOFRAN) IV  Allergies:   No Known Allergies  Social History:   Social History   Socioeconomic History  . Marital status: Married    Spouse name: Not on file  . Number of children: Not on file  . Years of  education: Not on file  . Highest education level: Not on file  Occupational History  . Not on file  Tobacco Use  . Smoking status: Never Smoker  . Smokeless tobacco: Never Used  Substance and Sexual Activity  . Alcohol use: No  . Drug use: No  . Sexual activity: Not Currently    Birth control/protection: None  Other Topics Concern  . Not on file  Social History Narrative  . Not on file   Social Determinants of Health   Financial Resource Strain:   . Difficulty of Paying Living Expenses: Not on file  Food Insecurity:   . Worried About Charity fundraiser in the Last Year: Not on file  . Ran Out of Food in the Last Year: Not on file  Transportation Needs:   . Lack of Transportation (Medical): Not on file  . Lack of Transportation (Non-Medical): Not on file  Physical Activity:   . Days of Exercise per Week: Not on file  . Minutes of Exercise per Session: Not on file  Stress:   . Feeling of Stress : Not on file  Social Connections:   . Frequency of Communication with Friends and Family: Not on file  .  Frequency of Social Gatherings with Friends and Family: Not on file  . Attends Religious Services: Not on file  . Active Member of Clubs or Organizations: Not on file  . Attends Archivist Meetings: Not on file  . Marital Status: Not on file  Intimate Partner Violence:   . Fear of Current or Ex-Partner: Not on file  . Emotionally Abused: Not on file  . Physically Abused: Not on file  . Sexually Abused: Not on file    Family History:   Family History  Problem Relation Age of Onset  . Diabetes Mother   . Diabetes Father   . Heart disease Father   . Cerebral palsy Sister   . Liver disease Neg Hx   . Colon cancer Neg Hx      ROS:  Please see the history of present illness.  All other ROS reviewed and negative.     Physical Exam/Data:   Vitals:   09/30/19 0815 09/30/19 0945 09/30/19 1015 09/30/19 1115  BP: (!) 116/47 112/68 (!) 109/51 (!) 110/50  Pulse: 65 60 (!) 58 (!) 56  Resp: 14 18 14 12   Temp:      TempSrc:      SpO2: 99% 100% 97% 97%  Weight:       No intake or output data in the 24 hours ending 09/30/19 1213 Last 3 Weights 09/30/2019 10/03/2017 08/28/2017  Weight (lbs) 150 lb 169 lb 168 lb  Weight (kg) 68.04 kg 76.658 kg 76.204 kg     Body mass index is 24.96 kg/m.  General:  Frail elderly WF in no acute distress HEENT: normal Lymph: no adenopathy Neck: no JVD Endocrine:  No thryomegaly Vascular: No carotid bruits; FA pulses 2+ bilaterally without bruits  Cardiac:  normal S1, S2; RRR; systolic murmur  Lungs:  + expiratory wheezing, no rhonchi or rales  Abd: soft, nontender, no hepatomegaly  Ext: no edema Musculoskeletal:  No deformities, BUE and BLE strength normal and equal Skin: warm and dry  Neuro:  CNs 2-12 intact, no focal abnormalities noted Psych:  Normal affect   EKG:  The EKG was personally reviewed and demonstrates:  Afib RVR, 112 bpm, minimal ST depression V3-V5, II, repol abnormality Telemetry:  Telemetry was personally reviewed and  demonstrates:  On arrival Afib rates to 120s converted to NSR at 4:45AM has been in NSR HR 60 with some sinus bradycardia  Relevant CV Studies:  Echo ordered  Laboratory Data:  High Sensitivity Troponin:   Recent Labs  Lab 09/30/19 0026 09/30/19 0346 09/30/19 0829  TROPONINIHS 11 32* 41*     Chemistry Recent Labs  Lab 09/30/19 0026  NA 139  K 3.2*  CL 109  CO2 17*  GLUCOSE 140*  BUN 12  CREATININE 1.45*  CALCIUM 9.2  GFRNONAA 35*  GFRAA 40*  ANIONGAP 13    Recent Labs  Lab 09/30/19 0026  PROT 6.8  ALBUMIN 2.6*  AST 40  ALT 26  ALKPHOS 114  BILITOT 1.1   Hematology Recent Labs  Lab 09/30/19 0026  WBC 2.5*  RBC 3.75*  HGB 10.2*  HCT 33.3*  MCV 88.8  MCH 27.2  MCHC 30.6  RDW 14.6  PLT 54*   BNPNo results for input(s): BNP, PROBNP in the last 168 hours.  DDimer No results for input(s): DDIMER in the last 168 hours.   Radiology/Studies:  DG Chest Port 1 View  Result Date: 09/30/2019 CLINICAL DATA:  Chest pain, shortness of breath EXAM: PORTABLE CHEST 1 VIEW COMPARISON:  Radiograph 07/06/2014, CT 03/01/2017 FINDINGS: Chronically coarsened interstitial changes with some bandlike opacities favoring atelectasis in the setting of low lung volumes. No consolidative opacity. No pneumothorax or effusion. Cardiac pacer pad telemetry leads overlie the chest wall. Cardiomediastinal silhouette is similar to prior. Postsurgical changes of right axilla and breast. No acute osseous or soft tissue abnormality. Degenerative changes are present in the imaged spine and shoulders. Mild dextrocurvature of the midthoracic spine. IMPRESSION: 1. Low lung volumes with bandlike opacities favoring atelectasis in the setting of low lung volumes. Electronically Signed   By: Lovena Le M.D.   On: 09/30/2019 01:01   {  Assessment and Plan:   ?Chest pain Patient reportedly had CP but on my interview did not remember having CP or SOB. - Hs troponin trend is flat, 11 > 32 > 41. Not  consistent with ACS - No previous cardiac h/o - Check echo - risk factors include DM, HTN, HLD. Can consider stress test vs cardiac CT for ischemic evaluation. Could possibly be outpatient since patient is asymptomatic and CP likely secondary to Afib RVR - Md to see  New onset Afib RVR - patient was found to have afib RVR that converted to NSR after dose of metoprolol - Patient ha no known h/o of afib. She is unsure if she had any significant symptoms with it. Does remember feeling tired.  - Started on IV heparin - Check echo - CHADSVASC = 5  (DM2, HTN, female, age)This patients CHA2DS2-VASc Score and unadjusted Ischemic Stroke Rate (% per year) is equal to 7.2 % stroke rate/year from a score of 5 Above score calculated as 1 point each if present [CHF, HTN, DM, Vascular=MI/PAD/Aortic Plaque, Age if 65-74, or Female] Above score calculated as 2 points each if present [Age > 75, or Stroke/TIA/TE] - [facility] to [facility] patient will need long-term a/c however with history of esophageal varices and thrombocytopenia will need to discuss plan with MD. Patient denies recent bleeding. Hgb 10.2. Can consider half dose anticoagulation.   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   79 y.o. female with a hx of breast cancer (10 years ago), cirrhosis, esophageal varices, DM2, HTN, HLD who is being seen today for the evaluation of chest pain and new onset atrial fibrillation at the request of Dr. Maylene Roes. Melissa Rose has not been seen by cardiology in the past.  Patient is accompanied by her husband. Both are poor historians.  They do not exactly remember what happened.  They live together and are able to perform all activities of daily living, he states that she is minimally active and only walks around the house.   They are not sure if she had chest pain but he found her rather tired, she was making incoherent noise, and called EMS.  She also vomited once.  Otherwise she currently denies chest pain and does not member if she had chest pain before, no lower extremity edema orthopnea no proximal nocturnal dyspnea.  She denies any palpitations.  She was noted to be in A. fib with RVR, EKG shows A. fib with RVR and ventricular rates 120 bpm, nonspecific ST-T wave abnormalities.  X-ray shows no acute disease.  On physical exam she is laying comfortably in bed, she has no JVDs, S1-S2 lungs are clear bilaterally her lower extremities are warm, there is no edema and good peripheral pulses.  Her troponin was minimally elevated with flat trend most probably can be attributed to A. fib with RVR, her TSH is elevated at 10.6, free T4 is 1.08, her potassium is low at 3.2, creatinine slightly elevated and 1.45, her baseline is between 1.4-1.5, her hemoglobin is 10.2 and platelets 54.   The patient is currently in sinus rhythm and very comfortable.  She had no signs of heart failure, she is chest pain-free.  We will send her home from ER, we will arrange for outpatient follow-up, we will repeat her TSH free T3 and free T4 in 3 months.  She will have an outpatient echo arranged in our clinic.  She can go home from the ER.  We will supplement potassium.  Regarding anticoagulation she is CHA2DS2-VASc 6, however she has cirrhosis with esophageal varices and prior history of GI bleeds, she would be a poor candidate for anticoagulation, we will recommend not to start systemic anticoagulation at this point as her risk of bleeding would be higher than prevention of  stroke.  Ena Dawley, MD 09/30/2019

## 2019-09-30 NOTE — Consult Note (Addendum)
Cardiology Consultation:   Patient ID: Melissa Rose MRN: [id number]; DOB: 1941/06/11  Admit date: 09/30/2019 Date of Consult: 09/30/2019  Primary Care Provider: Neale Burly, MD Primary Cardiologist: Ena Dawley, MD  Primary Electrophysiologist:  None    Patient Profile:   Melissa Rose is a 79 y.o. female with a hx of of breast cancer (10 years ago), cirrhosis, esophageal varices, DM2, HTN, HLD who is being seen today for the evaluation of chest pain and new onset atrial fibrillation at the request of Dr. Maylene Roes.   History of Present Illness:   Melissa Rose has not been seen by cardiology in the past. Patient is accompanied by her husband. Both are poor historians. Deny known history of MI, stent, heart failure, and stroke. Family history is positive for DM, no cardiac h/o. No tobacco/alcohol/drug use. Live together and able to care for each other.  The patient presented to the ED 09/29/18 for ?chest pain and leg pain. On my interview the patient did not remember if she had chest pain. She does not remember events of what happened or how she felt. The husband reported that he was watching TV and she was in her bed. The patient does think she felt more tired than usual. She did not remember having chest pain or SOB. The husband went into her room and asked if anything was wrong and he reported she was making noise but no coherent words were coming out. They have a neighbor who visits every day and she came over and saw something was off with the patient and called EMS. No nausea or diaphoresis. Husband says she has been vomiting (non-bloody) once a day. No lower leg edema or orthopnea. No recent illness, fever, chills. On the way EMS activated code STEMI which was canceled on arrival. They also reported BG 26. She was noted to have afib RVR.  In the ED BP 101/77, pulse 102, afebrile, RR 19, 97% O2. Labs showed potassium 3.2, CO2 17, glucose 140, creatinine 1.45. albumin 2.6. WBC 2.5, Hgb  10.2. INR 1.5. TSH 10.663 T$ 1.08. Marland Kitchen Hs troponin 11 > 32 > 41. Hgb A1C 9.6. COVID negative. Ammonia 62. CXR showed atelectasis int he setting of low lung volumes. EKG showed Afib RVR, 112 bpm with minimal ST depression in V3,V4, V5, II, and repolarization abnormality. She was given D50 for low BG with improvement. Patient was started on heparin drip. She was given metoprolol with conversion to NSR.  Heart Pathway Score:     Past Medical History:  Diagnosis Date  . Arthritis   . Breast cancer ([place]) 2000   treated with chemo/radiation  . Dementia ([place])    slight  . Diabetes mellitus without complication ([place])   . GERD (gastroesophageal reflux disease)   . Hyperlipidemia   . Hypertension   . Hypothyroidism     Past Surgical History:  Procedure Laterality Date  . BREAST SURGERY Right   . 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 COLONOSCOPY  11/2016   Dr. Ladona Horns: normal  . ESOPHAGEAL BANDING N/A 02/13/2017   Procedure: ESOPHAGEAL BANDING;  Surgeon: Daneil Dolin, MD;  [facility];  Service: Endoscopy;  Laterality: N/A;  . ESOPHAGEAL BANDING N/A 03/25/2017   Procedure: ESOPHAGEAL BANDING;  Surgeon: Daneil Dolin, MD;  [facility];  Service: Endoscopy;  Laterality: N/A;  Esophageal varices banding  . ESOPHAGEAL BANDING N/A 06/03/2017   Procedure: ESOPHAGEAL  BANDING;  Surgeon: Daneil Dolin, MD;  [facility];  Service: Endoscopy;  Laterality: N/A;  varices  . ESOPHAGOGASTRODUODENOSCOPY  11/2016   Dr. 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  . ESOPHAGOGASTRODUODENOSCOPY (EGD) WITH PROPOFOL N/A 02/13/2017   Procedure: ESOPHAGOGASTRODUODENOSCOPY (EGD) WITH PROPOFOL;  Surgeon: Daneil Dolin, MD;  [facility];  Service: Endoscopy;  Laterality: N/A;  145   .  ESOPHAGOGASTRODUODENOSCOPY (EGD) WITH PROPOFOL N/A 03/25/2017   Procedure: ESOPHAGOGASTRODUODENOSCOPY (EGD) WITH PROPOFOL;  Surgeon: Daneil Dolin, MD;  [facility];  Service: Endoscopy;  Laterality: N/A;  7:30am  . ESOPHAGOGASTRODUODENOSCOPY (EGD) WITH PROPOFOL N/A 06/03/2017   Procedure: ESOPHAGOGASTRODUODENOSCOPY (EGD) WITH PROPOFOL;  Surgeon: Daneil Dolin, MD;  [facility];  Service: Endoscopy;  Laterality: N/A;  8:30am     Home Medications:  Prior to Admission medications   Medication Sig Start Date End Date Taking? Authorizing Provider  atorvastatin (LIPITOR) 10 MG tablet Take 10 mg by mouth at bedtime.  11/02/16   [provider]  Calcium Carb-Cholecalciferol (CALCIUM 600/VITAMIN D3) 600-800 MG-UNIT TABS Take 1 tablet by mouth daily.    [provider]  donepezil (ARICEPT) 10 MG tablet Take 10 mg by mouth every evening.  11/02/16   [provider]  furosemide (LASIX) 20 MG tablet Take 20 mg by mouth daily.    [provider]  glipiZIDE (GLUCOTROL) 5 MG tablet Take 5 mg by mouth 2 (two) times daily. 04/27/14   [provider]  insulin aspart (NOVOLOG FLEXPEN) 100 UNIT/ML FlexPen Inject 35 Units into the skin 2 (two) times daily with a meal.    [provider]  lactulose (CHRONULAC) 10 GM/15ML solution Take 10 g by mouth 2 (two) times daily.    [provider]  levothyroxine (SYNTHROID, LEVOTHROID) 100 MCG tablet Take 100 mcg by mouth daily.  11/02/16   [provider]  lisinopril (PRINIVIL,ZESTRIL) 10 MG tablet Take 10 mg by mouth daily. 04/27/14   [provider]  metFORMIN (GLUCOPHAGE) 1000 MG tablet Take 1,000 mg by mouth 2 (two) times daily.    [provider]  mupirocin nasal ointment (BACTROBAN) 2 % Place 1 application into the nose 2 (two) times daily. Use one-half of tube in each nostril twice daily for five (5) days. After application, press sides of nose together and gently  massage.    [provider]  propranolol (INDERAL) 10 MG tablet Take 1 tablet (10 mg total) by mouth 2 (two) times daily. 08/28/17   Carlis Stable, NP  ranitidine (ZANTAC) 300 MG tablet Take 300 mg by mouth 2 (two) times daily.  11/02/16   [provider]  spironolactone (ALDACTONE) 25 MG tablet Take 12.5 mg by mouth 2 (two) times daily.    [provider]    Inpatient Medications: Scheduled Meds: . insulin aspart  0-9 Units Subcutaneous Q4H  . lactulose  10 g Oral BID  . levothyroxine  100 mcg Oral Daily  . propranolol  10 mg Oral BID   Continuous Infusions: . heparin 1,100 Units/hr (09/30/19 1224)   PRN Meds: acetaminophen, ondansetron (ZOFRAN) IV  Allergies:   No Known Allergies  Social History:   Social History   Socioeconomic History  . Marital status: Married    Spouse name: Not on file  . Number of children: Not on file  . Years of education:  Not on file  . Highest education level: Not on file  Occupational History  . Not on file  Tobacco Use  . Smoking status: Never Smoker  . Smokeless tobacco: Never Used  Substance and Sexual Activity  . Alcohol use: No  . Drug use: No  . Sexual activity: Not Currently    Birth control/protection: None  Other Topics Concern  . Not on file  Social History Narrative  . Not on file   Social Determinants of Health   Financial Resource Strain:   . Difficulty of Paying Living Expenses: Not on file  Food Insecurity:   . Worried About Charity fundraiser in the Last Year: Not on file  . Ran Out of Food in the Last Year: Not on file  Transportation Needs:   . Lack of Transportation (Medical): Not on file  . Lack of Transportation (Non-Medical): Not on file  Physical Activity:   . Days of Exercise per Week: Not on file  . Minutes of Exercise per Session: Not on file  Stress:   . Feeling of Stress : Not on file  Social Connections:   . Frequency of Communication with Friends and Family: Not on file  .  Frequency of Social Gatherings with Friends and Family: Not on file  . Attends Religious Services: Not on file  . Active Member of Clubs or Organizations: Not on file  . Attends Archivist Meetings: Not on file  . Marital Status: Not on file  Intimate Partner Violence:   . Fear of Current or Ex-Partner: Not on file  . Emotionally Abused: Not on file  . Physically Abused: Not on file  . Sexually Abused: Not on file    Family History:   Family History  Problem Relation Age of Onset  . Diabetes Mother   . Diabetes Father   . Heart disease Father   . Cerebral palsy Sister   . Liver disease Neg Hx   . Colon cancer Neg Hx      ROS:  Please see the history of present illness.  All other ROS reviewed and negative.     Physical Exam/Data:   Vitals:   09/30/19 0815 09/30/19 0945 09/30/19 1015 09/30/19 1115  BP: (!) 116/47 112/68 (!) 109/51 (!) 110/50  Pulse: 65 60 (!) 58 (!) 56  Resp: 14 18 14 12   Temp:      TempSrc:      SpO2: 99% 100% 97% 97%  Weight:       No intake or output data in the 24 hours ending 09/30/19 1402 Last 3 Weights 09/30/2019 10/03/2017 08/28/2017  Weight (lbs) 150 lb 169 lb 168 lb  Weight (kg) 68.04 kg 76.658 kg 76.204 kg     Body mass index is 24.96 kg/m.  General:  Elderly WF, in no acute distress HEENT: normal Lymph: no adenopathy Neck: no JVD Endocrine:  No thryomegaly Vascular: No carotid bruits; FA pulses 2+ bilaterally without bruits  Cardiac:  normal S1, S2; RRR; systolic murmur  Lungs:  + wheezing, rhonchi or rales  Abd: soft, nontender, no hepatomegaly  Ext: no edema Musculoskeletal:  No deformities, BUE and BLE strength normal and equal Skin: warm and dry  Neuro:  CNs 2-12 intact, no focal abnormalities noted Psych:  Normal affect   EKG:  The EKG was personally reviewed and demonstrates:  Afib RVR, 112 bpm, minimal ST depression V3-V5, II, repol abnormality Telemetry:  Telemetry was personally reviewed and demonstrates:  On  arrival  Afib rates to 120s converted to NSR at 4:45AM has been in NSR HR 60 with some sinus bradycardia  Relevant CV Studies:  Echo ordered  Laboratory Data:  High Sensitivity Troponin:   Recent Labs  Lab 09/30/19 0026 09/30/19 0346 09/30/19 0829 09/30/19 1035  TROPONINIHS 11 32* 41* 33*     Chemistry Recent Labs  Lab 09/30/19 0026  NA 139  K 3.2*  CL 109  CO2 17*  GLUCOSE 140*  BUN 12  CREATININE 1.45*  CALCIUM 9.2  GFRNONAA 35*  GFRAA 40*  ANIONGAP 13    Recent Labs  Lab 09/30/19 0026  PROT 6.8  ALBUMIN 2.6*  AST 40  ALT 26  ALKPHOS 114  BILITOT 1.1   Hematology Recent Labs  Lab 09/30/19 0026  WBC 2.5*  RBC 3.75*  HGB 10.2*  HCT 33.3*  MCV 88.8  MCH 27.2  MCHC 30.6  RDW 14.6  PLT 54*   BNPNo results for input(s): BNP, PROBNP in the last 168 hours.  DDimer No results for input(s): DDIMER in the last 168 hours.   Radiology/Studies:  DG Chest Port 1 View  Result Date: 09/30/2019 CLINICAL DATA:  Chest pain, shortness of breath EXAM: PORTABLE CHEST 1 VIEW COMPARISON:  Radiograph 07/06/2014, CT 03/01/2017 FINDINGS: Chronically coarsened interstitial changes with some bandlike opacities favoring atelectasis in the setting of low lung volumes. No consolidative opacity. No pneumothorax or effusion. Cardiac pacer pad telemetry leads overlie the chest wall. Cardiomediastinal silhouette is similar to prior. Postsurgical changes of right axilla and breast. No acute osseous or soft tissue abnormality. Degenerative changes are present in the imaged spine and shoulders. Mild dextrocurvature of the midthoracic spine. IMPRESSION: 1. Low lung volumes with bandlike opacities favoring atelectasis in the setting of low lung volumes. Electronically Signed   By: Lovena Le M.D.   On: 09/30/2019 01:01   {  Assessment and Plan:   ?Chest pain Patient reportedly had CP but on my interview did not remember having CP or SOB. - Hs troponin trend is flat, 11 > 32 > 41. Not  consistent with ACS - No previous cardiac h/o - Check echo - risk factors include DM, HTN, HLD. Can consider stress test vs cardiac CT for ischemic evaluation. Could possibly be outpatient since patient is asymptomatic and CP likely secondary to Afib RVR - Md to see  New onset Afib RVR - patient was found to have afib RVR that converted to NSR after dose of metoprolol - Patient ha no known h/o of afib. She is unsure if she had any significant symptoms with it. Does remember feeling tired.  - Started on IV heparin - Check echo - CHADSVASC = 5  (DM2, HTN, female, age)This patients CHA2DS2-VASc Score and unadjusted Ischemic Stroke Rate (% per year) is equal to 7.2 % stroke rate/year from a score of 5 Above score calculated as 1 point each if present [CHF, HTN, DM, Vascular=MI/PAD/Aortic Plaque, Age if 65-74, or Female] Above score calculated as 2 points each if present [Age > 75, or Stroke/TIA/TE] - According to [facility] patient will need long-term a/c however with history of esophageal varices and thrombocytopenia will need to discuss plan with MD. Patient denies recent bleeding. Hgb 10.2. Can consider half dose anticoagulation.   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   79 y.o. female with a hx of breast cancer (10 years ago), cirrhosis, esophageal varices, DM2, HTN, HLD who is being seen today for the evaluation of chest pain and new onset atrial fibrillation at the request of Dr. Maylene Roes. Melissa Rose has not been seen by cardiology in  the past. Patient is accompanied by her husband. Both are poor historians.  They do not exactly remember what happened.  They live together and are able to perform all activities of daily living, he states that she is minimally active and only walks around the house.   They are not sure if she had chest pain but he found her rather tired, she was making incoherent noise, and called EMS.  She also vomited once.  Otherwise she currently denies chest pain and does not member if she had chest pain before, no lower extremity edema orthopnea no proximal nocturnal dyspnea.  She denies any palpitations.  She was noted to be in A. fib with RVR, EKG shows A. fib with RVR and ventricular rates 120 bpm, nonspecific ST-T wave abnormalities.  X-ray shows no acute disease.  On physical exam she is laying comfortably in bed, she has no JVDs, S1-S2 lungs are clear bilaterally her lower extremities are warm, there is no edema and good peripheral pulses.  Her troponin was minimally elevated with flat trend most probably can be attributed to A. fib with RVR, her TSH is elevated at 10.6, free T4 is 1.08, her potassium is low at 3.2, creatinine slightly elevated and 1.45, her baseline is between 1.4-1.5, her hemoglobin is 10.2 and platelets 54.   The patient is currently in sinus rhythm and very comfortable.  She had no signs of heart failure, she is chest pain-free.  We will send her home from ER, we will arrange for outpatient follow-up, we will repeat her TSH free T3 and free T4 in 3 months.  She will have an outpatient echo arranged in our clinic.  She can go home from the ER.  We will supplement potassium.  Regarding anticoagulation she is CHA2DS2-VASc 6, however she has cirrhosis with esophageal varices and prior history of GI bleeds, she would be a poor candidate for anticoagulation, we will recommend not to start systemic anticoagulation at this point as her risk of bleeding would be higher than prevention of  stroke.  Ena Dawley, MD 09/30/2019

## 2019-09-30 NOTE — ED Notes (Signed)
Dr Roxanne Mins at bedside with patient. Agreeable to activation of STEMI. Cath lkab MD on phone with Dr. Alvester Chou. Two large bore PIV in place. Blood glucose 128 at this time. Pt answering questions appropriately at this time.

## 2019-09-30 NOTE — ED Triage Notes (Signed)
Pt BIB [facility] ambulance. Initial call for leg pain, EMS reports blood glucose of 26, reportedly went unresponsive, and became ersponsive again after administration of dextrose 50%. Pt c/o pain to chest and legs. EMS reported EKG questionable for possible stemi. Pt alert and oriented to her baseline.

## 2019-09-30 NOTE — Progress Notes (Signed)
  PROGRESS NOTE  Patient admitted earlier this morning. See H&P. Melissa Rose is a 79 year old female with past medical history significant for cirrhosis, esophageal varices, type 2 diabetes, dementia, hypertension.  Patient was brought in by EMS due to complaints of chest pain and leg pain.  She was also found to have new onset A. fib on the monitor.  Had hypoglycemic episode that resolved with D50.  Initially, came in as code STEMI although this was canceled.  A. fib RVR rate 120s, converted to normal sinus rhythm.  She was started on heparin drip and cardiology consulted.  Currently, patient denies any chest pain on examination.  Replace potassium.  Cardiology consult pending.  Remains on IV heparin drip.   Dessa Phi, DO [facility] 09/30/2019, 10:02 AM  Available via Epic secure chat 7am-7pm After these hours, please refer to coverage provider listed on [email or web address]

## 2019-09-30 NOTE — Progress Notes (Signed)
ANTICOAGULATION CONSULT NOTE - Initial Consult  Pharmacy Consult for Heparin Indication: atrial fibrillation  No Known Allergies  Patient Measurements: Weight: 150 lb (68 kg)  Vital Signs: Temp: 96.7 F (35.9 C) (01/06 0020) Temp Source: Tympanic (01/06 0020) BP: 119/79 (01/06 0200) Pulse Rate: 111 (01/06 0200)  Labs: Recent Labs    09/30/19 0026  HGB 10.2*  HCT 33.3*  PLT 54*  LABPROT 17.8*  INR 1.5*    CrCl cannot be calculated (Patient's most recent lab result is older than the maximum 21 days allowed.).   Medical History: Past Medical History:  Diagnosis Date  . Arthritis   . Breast cancer ([place]) 2000   treated with chemo/radiation  . Dementia ([place])    slight  . Diabetes mellitus without complication ([place])   . GERD (gastroesophageal reflux disease)   . Hyperlipidemia   . Hypertension   . Hypothyroidism     Medications:  No current facility-administered medications on file prior to encounter.   Current Outpatient Medications on File Prior to Encounter  Medication Sig Dispense Refill  . atorvastatin (LIPITOR) 10 MG tablet Take 10 mg by mouth at bedtime.     . Calcium Carb-Cholecalciferol (CALCIUM 600/VITAMIN D3) 600-800 MG-UNIT TABS Take 1 tablet by mouth daily.    Marland Kitchen donepezil (ARICEPT) 10 MG tablet Take 10 mg by mouth every evening.     . furosemide (LASIX) 20 MG tablet Take 20 mg by mouth daily.    Marland Kitchen glipiZIDE (GLUCOTROL) 5 MG tablet Take 5 mg by mouth 2 (two) times daily.    . insulin aspart (NOVOLOG FLEXPEN) 100 UNIT/ML FlexPen Inject 35 Units into the skin 2 (two) times daily with a meal.    . lactulose (CHRONULAC) 10 GM/15ML solution Take 10 g by mouth 2 (two) times daily.    Marland Kitchen levothyroxine (SYNTHROID, LEVOTHROID) 100 MCG tablet Take 100 mcg by mouth daily.     Marland Kitchen lisinopril (PRINIVIL,ZESTRIL) 10 MG tablet Take 10 mg by mouth daily.    . metFORMIN (GLUCOPHAGE) 1000 MG tablet Take 1,000 mg by mouth 2 (two) times daily.    . mupirocin nasal ointment  (BACTROBAN) 2 % Place 1 application into the nose 2 (two) times daily. Use one-half of tube in each nostril twice daily for five (5) days. After application, press sides of nose together and gently massage.    . propranolol (INDERAL) 10 MG tablet Take 1 tablet (10 mg total) by mouth 2 (two) times daily. 60 tablet 5  . ranitidine (ZANTAC) 300 MG tablet Take 300 mg by mouth 2 (two) times daily.     Marland Kitchen spironolactone (ALDACTONE) 25 MG tablet Take 12.5 mg by mouth 2 (two) times daily.       Assessment: 79 y.o. female with chest pain and Afib for heparin  Goal of Therapy:  Heparin level 0.3-0.7 units/ml Monitor platelets by anticoagulation protocol: Yes   Plan:  Heparin 3000 units IV bolus, then start heparin 900 units/hr Check heparin level in 8 hours.   Caryl Pina 09/30/2019,2:24 AM

## 2019-10-13 NOTE — Progress Notes (Deleted)
Cardiology Office Note  Date: 10/14/2019   ID: Melissa Rose, DOB 08-10-1941, MRN [id number]  PCP:  Neale Burly, MD  Cardiologist:  Ena Dawley, MD Electrophysiologist:  None   No chief complaint on file.   History of Present Illness: Melissa Rose is a 79 y.o. female with recent presentation to the emergency room September 30, 2019 with complaints of chest pain and leg pain.  Patient also reported some slight dyspnea.  Patient was noted to be in atrial fibrillation with RVR with a rate of 120 on emergency room monitor.  EMS reported patient had a brief episode of unresponsiveness with a blood sugar of 26.  She was administered IV dextrose and level of consciousness increased.  She had elevated troponin I of 33, 41, 32 respectively.  Elevated TSH 10.6, ammonia 62, random glucose of 128.  Creatinine 1.45, GFR 35, hemoglobin 10.2, hematocrit 33.3, platelets 54, INR of 1.5, free T4 1.08.  Hemoglobin A1c 9.6.  SARS coronavirus 2 screening was negative.  Patient's atrial fibrillation converted to normal sinus rhythm following administration of metoprolol.  Her CHA2DS2-VASc score was 5.  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  Echo and stress test??  Repeat EKG today?  Anticoagulation?   Past Medical History:  Diagnosis Date  . Arthritis   . Breast cancer ([place]) 2000   treated with chemo/radiation  . Dementia ([place])    slight  . Diabetes mellitus without complication ([place])   . GERD (gastroesophageal reflux disease)   . Hyperlipidemia   . Hypertension   . Hypothyroidism     Past Surgical History:  Procedure Laterality Date  . BREAST SURGERY Right   . 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 COLONOSCOPY  11/2016   Dr. Ladona Horns: normal  .  ESOPHAGEAL BANDING N/A 02/13/2017   Procedure: ESOPHAGEAL BANDING;  Surgeon: Daneil Dolin, MD;  [facility];  Service: Endoscopy;  Laterality: N/A;  . ESOPHAGEAL BANDING N/A 03/25/2017   Procedure: ESOPHAGEAL BANDING;  Surgeon: Daneil Dolin, MD;  [facility];  Service: Endoscopy;  Laterality: N/A;  Esophageal varices banding  . ESOPHAGEAL BANDING N/A 06/03/2017   Procedure: ESOPHAGEAL BANDING;  Surgeon: Daneil Dolin, MD;  [facility];  Service: Endoscopy;  Laterality: N/A;  varices  . ESOPHAGOGASTRODUODENOSCOPY  11/2016   Dr. 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  . ESOPHAGOGASTRODUODENOSCOPY (EGD) WITH PROPOFOL N/A 02/13/2017   Procedure: ESOPHAGOGASTRODUODENOSCOPY (EGD) WITH PROPOFOL;  Surgeon: Daneil Dolin, MD;  [facility];  Service: Endoscopy;  Laterality: N/A;  145   . ESOPHAGOGASTRODUODENOSCOPY (EGD) WITH PROPOFOL N/A 03/25/2017   Procedure: ESOPHAGOGASTRODUODENOSCOPY (EGD) WITH PROPOFOL;  Surgeon: Daneil Dolin, MD;  [facility];  Service: Endoscopy;  Laterality: N/A;  7:30am  . ESOPHAGOGASTRODUODENOSCOPY (EGD) WITH PROPOFOL N/A 06/03/2017   Procedure: ESOPHAGOGASTRODUODENOSCOPY (EGD) WITH PROPOFOL;  Surgeon: Daneil Dolin, MD;  [facility];  Service: Endoscopy;  Laterality: N/A;  8:30am    Current Outpatient Medications  Medication Sig Dispense Refill  . atorvastatin (LIPITOR) 10 MG tablet Take 10 mg by mouth at bedtime.     . Calcium Carb-Cholecalciferol (CALCIUM 600/VITAMIN D3) 600-800 MG-UNIT TABS Take  1 tablet by mouth daily.    Marland Kitchen donepezil (ARICEPT) 10 MG tablet Take 10 mg by mouth every evening.     . furosemide (LASIX) 20 MG tablet Take 20 mg by mouth daily.    Marland Kitchen glipiZIDE (GLUCOTROL) 5 MG tablet Take 5 mg by mouth 2 (two) times daily.    . insulin aspart (NOVOLOG FLEXPEN) 100 UNIT/ML FlexPen  Inject 35 Units into the skin 2 (two) times daily with a meal.    . lactulose (CHRONULAC) 10 GM/15ML solution Take 10 g by mouth 2 (two) times daily.    Marland Kitchen levothyroxine (SYNTHROID, LEVOTHROID) 100 MCG tablet Take 100 mcg by mouth daily.     Marland Kitchen lisinopril (PRINIVIL,ZESTRIL) 10 MG tablet Take 10 mg by mouth daily.    . mupirocin nasal ointment (BACTROBAN) 2 % Place 1 application into the nose 2 (two) times daily. Use one-half of tube in each nostril twice daily for five (5) days. After application, press sides of nose together and gently massage.    . propranolol (INDERAL) 10 MG tablet Take 1 tablet (10 mg total) by mouth 2 (two) times daily. 60 tablet 5  . ranitidine (ZANTAC) 300 MG tablet Take 300 mg by mouth 2 (two) times daily.     Marland Kitchen spironolactone (ALDACTONE) 25 MG tablet Take 12.5 mg by mouth 2 (two) times daily.     No current facility-administered medications for this visit.   Allergies:  Patient has no known allergies.   Social History: The patient  reports that she has never smoked. She has never used smokeless tobacco. She reports that she does not drink alcohol or use drugs.   Family History: The patient's family history includes Cerebral palsy in her sister; Diabetes in her father and mother; Heart disease in her father.   ROS:  Please see the history of present illness. Otherwise, complete review of systems is positive for none.  All other systems are reviewed and negative.   Physical Exam: VS:  There were no vitals taken for this visit., BMI There is no height or weight on file to calculate BMI.  Wt Readings from Last 3 Encounters:  09/30/19 150 lb (68 kg)  10/03/17 169 lb (76.7 kg)  08/28/17 168 lb (76.2 kg)    General: Patient appears comfortable at rest. HEENT: Conjunctiva and lids normal, oropharynx clear with moist mucosa. Neck: Supple, no elevated JVP or carotid bruits, no thyromegaly. Lungs: Clear to auscultation, nonlabored breathing at rest. Cardiac: Regular rate  and rhythm, no S3 or significant systolic murmur, no pericardial rub. Abdomen: Soft, nontender, no hepatomegaly, bowel sounds present, no guarding or rebound. Extremities: No pitting edema, distal pulses 2+. Skin: Warm and dry. Musculoskeletal: No kyphosis. Neuropsychiatric: Alert and oriented x3, affect grossly appropriate.  ECG:  An ECG dated September 30, 2019 was personally reviewed today and demonstrated:  Atrial fibrillation with RVR rate of 122.  Repolarization abnormality, severe global ischemia with prolonged QT greater than 500 ms Previous EKG Feb 08, 2018 sinus bradycardia with a rate of 57.  Nonspecific T wave abnormality. Recent Labwork: 09/30/2019: ALT 26; AST 40; BUN 12; Creatinine, Ser 1.45; Hemoglobin 10.2; Platelets 54; Potassium 3.2; Sodium 139; TSH 10.663  No results found for: CHOL, TRIG, HDL, CHOLHDL, VLDL, LDLCALC, LDLDIRECT  Other Studies Reviewed Today:   Assessment and Plan:  1. Paroxysmal atrial fibrillation (HCC)   2. Essential hypertension   3. Mixed hyperlipidemia   4. Chest discomfort   5. Dyspnea, unspecified type    .1.  Paroxysmal atrial fibrillation (HCC) ***  2. Essential hypertension ***  3. Mixed hyperlipidemia ***  4. Chest discomfort ***  5. Dyspnea, unspecified type ***  Medication Adjustments/Labs and Tests Ordered: Current medicines are reviewed at length with the patient today.  Concerns regarding medicines are outlined above.    There are no Patient Instructions on file for this visit.       Signed, Levell July, NP 10/14/2019 10:16 AM    [facility] [place], [place], [place] [address] Phone: [phone]; Fax: [phone]

## 2019-10-14 ENCOUNTER — Ambulatory Visit: Payer: Medicare Other | Admitting: Physician Assistant

## 2019-10-29 ENCOUNTER — Ambulatory Visit: Payer: Medicare Other | Attending: Internal Medicine

## 2019-10-29 DIAGNOSIS — Z23 Encounter for immunization: Secondary | ICD-10-CM

## 2019-10-29 NOTE — Progress Notes (Signed)
   Covid-19 Vaccination Clinic  Name:  Carol Sidney    MRN: [id number] DOB: [birth year]  10/29/2019  Ms. Tempesta was observed post Covid-19 immunization for 15 minutes without incidence. She was provided with Vaccine Information Sheet and instruction to access the V-Safe system.   Ms. Breiding was instructed to call 911 with any severe reactions post vaccine: Marland Kitchen Difficulty breathing  . Swelling of your face and throat  . A fast heartbeat  . A bad rash all over your body  . Dizziness and weakness    Immunizations Administered    Name Date Dose VIS Date Route   Moderna COVID-19 Vaccine 10/29/2019 12:00 PM 0.5 mL 08/25/2019 Intramuscular   Manufacturer: Moderna   Lot: [id number]   [id number]

## 2019-11-05 DIAGNOSIS — E119 Type 2 diabetes mellitus without complications: Secondary | ICD-10-CM | POA: Diagnosis not present

## 2019-11-05 DIAGNOSIS — M79601 Pain in right arm: Secondary | ICD-10-CM | POA: Diagnosis not present

## 2019-11-05 DIAGNOSIS — S40021A Contusion of right upper arm, initial encounter: Secondary | ICD-10-CM | POA: Diagnosis not present

## 2019-11-05 DIAGNOSIS — Z853 Personal history of malignant neoplasm of breast: Secondary | ICD-10-CM | POA: Diagnosis not present

## 2019-11-05 DIAGNOSIS — W06XXXA Fall from bed, initial encounter: Secondary | ICD-10-CM | POA: Diagnosis not present

## 2019-11-05 DIAGNOSIS — I10 Essential (primary) hypertension: Secondary | ICD-10-CM | POA: Diagnosis not present

## 2019-11-05 DIAGNOSIS — S4991XA Unspecified injury of right shoulder and upper arm, initial encounter: Secondary | ICD-10-CM | POA: Diagnosis not present

## 2019-11-24 DIAGNOSIS — E1143 Type 2 diabetes mellitus with diabetic autonomic (poly)neuropathy: Secondary | ICD-10-CM | POA: Diagnosis not present

## 2019-11-24 DIAGNOSIS — N182 Chronic kidney disease, stage 2 (mild): Secondary | ICD-10-CM | POA: Diagnosis not present

## 2019-11-24 DIAGNOSIS — K7469 Other cirrhosis of liver: Secondary | ICD-10-CM | POA: Diagnosis not present

## 2019-11-24 DIAGNOSIS — E1121 Type 2 diabetes mellitus with diabetic nephropathy: Secondary | ICD-10-CM | POA: Diagnosis not present

## 2019-11-24 DIAGNOSIS — I10 Essential (primary) hypertension: Secondary | ICD-10-CM | POA: Diagnosis not present

## 2019-11-24 DIAGNOSIS — Z0000 Encounter for general adult medical examination without abnormal findings: Secondary | ICD-10-CM | POA: Diagnosis not present

## 2019-11-24 DIAGNOSIS — E038 Other specified hypothyroidism: Secondary | ICD-10-CM | POA: Diagnosis not present

## 2019-11-30 ENCOUNTER — Ambulatory Visit: Payer: Medicare Other | Attending: Internal Medicine

## 2019-11-30 DIAGNOSIS — Z23 Encounter for immunization: Secondary | ICD-10-CM | POA: Insufficient documentation

## 2019-11-30 NOTE — Progress Notes (Signed)
   Covid-19 Vaccination Clinic  Name:  Melissa Rose    MRN: [id number] DOB: 01/05/1941  11/30/2019  Melissa Rose was observed post Covid-19 immunization for 15 minutes without incident. She was provided with Vaccine Information Sheet and instruction to access the V-Safe system.   Melissa Rose was instructed to call 911 with any severe reactions post vaccine: Marland Kitchen Difficulty breathing  . Swelling of face and throat  . A fast heartbeat  . A bad rash all over body  . Dizziness and weakness   Immunizations Administered    Name Date Dose VIS Date Route   Moderna COVID-19 Vaccine 11/30/2019 12:11 PM 0.5 mL 08/25/2019 Intramuscular   Manufacturer: Moderna   Lot: [id number]   [id number]

## 2020-02-24 DIAGNOSIS — K7469 Other cirrhosis of liver: Secondary | ICD-10-CM | POA: Diagnosis not present

## 2020-02-24 DIAGNOSIS — I10 Essential (primary) hypertension: Secondary | ICD-10-CM | POA: Diagnosis not present

## 2020-02-24 DIAGNOSIS — Z0000 Encounter for general adult medical examination without abnormal findings: Secondary | ICD-10-CM | POA: Diagnosis not present

## 2020-02-24 DIAGNOSIS — E1143 Type 2 diabetes mellitus with diabetic autonomic (poly)neuropathy: Secondary | ICD-10-CM | POA: Diagnosis not present

## 2020-02-24 DIAGNOSIS — E038 Other specified hypothyroidism: Secondary | ICD-10-CM | POA: Diagnosis not present

## 2020-02-24 DIAGNOSIS — N182 Chronic kidney disease, stage 2 (mild): Secondary | ICD-10-CM | POA: Diagnosis not present

## 2020-03-09 DIAGNOSIS — E1122 Type 2 diabetes mellitus with diabetic chronic kidney disease: Secondary | ICD-10-CM | POA: Diagnosis not present

## 2020-03-09 DIAGNOSIS — M7732 Calcaneal spur, left foot: Secondary | ICD-10-CM | POA: Diagnosis not present

## 2020-03-09 DIAGNOSIS — D649 Anemia, unspecified: Secondary | ICD-10-CM | POA: Diagnosis not present

## 2020-03-09 DIAGNOSIS — Z20822 Contact with and (suspected) exposure to covid-19: Secondary | ICD-10-CM | POA: Diagnosis not present

## 2020-03-09 DIAGNOSIS — D61818 Other pancytopenia: Secondary | ICD-10-CM | POA: Diagnosis not present

## 2020-03-09 DIAGNOSIS — E11621 Type 2 diabetes mellitus with foot ulcer: Secondary | ICD-10-CM | POA: Diagnosis not present

## 2020-03-09 DIAGNOSIS — E119 Type 2 diabetes mellitus without complications: Secondary | ICD-10-CM | POA: Diagnosis not present

## 2020-03-09 DIAGNOSIS — S91302A Unspecified open wound, left foot, initial encounter: Secondary | ICD-10-CM | POA: Diagnosis not present

## 2020-03-09 DIAGNOSIS — L03116 Cellulitis of left lower limb: Secondary | ICD-10-CM | POA: Diagnosis not present

## 2020-03-09 DIAGNOSIS — N1832 Chronic kidney disease, stage 3b: Secondary | ICD-10-CM | POA: Diagnosis not present

## 2020-03-09 DIAGNOSIS — N183 Chronic kidney disease, stage 3 unspecified: Secondary | ICD-10-CM | POA: Diagnosis not present

## 2020-03-09 DIAGNOSIS — E1165 Type 2 diabetes mellitus with hyperglycemia: Secondary | ICD-10-CM | POA: Diagnosis not present

## 2020-03-09 DIAGNOSIS — E11628 Type 2 diabetes mellitus with other skin complications: Secondary | ICD-10-CM | POA: Diagnosis not present

## 2020-03-09 DIAGNOSIS — D6959 Other secondary thrombocytopenia: Secondary | ICD-10-CM | POA: Diagnosis not present

## 2020-03-09 DIAGNOSIS — M7989 Other specified soft tissue disorders: Secondary | ICD-10-CM | POA: Diagnosis not present

## 2020-03-09 DIAGNOSIS — L02612 Cutaneous abscess of left foot: Secondary | ICD-10-CM | POA: Diagnosis not present

## 2020-03-09 DIAGNOSIS — M79662 Pain in left lower leg: Secondary | ICD-10-CM | POA: Diagnosis not present

## 2020-03-09 DIAGNOSIS — K7460 Unspecified cirrhosis of liver: Secondary | ICD-10-CM | POA: Diagnosis not present

## 2020-03-09 DIAGNOSIS — L97529 Non-pressure chronic ulcer of other part of left foot with unspecified severity: Secondary | ICD-10-CM | POA: Diagnosis not present

## 2020-04-23 DIAGNOSIS — M47812 Spondylosis without myelopathy or radiculopathy, cervical region: Secondary | ICD-10-CM | POA: Diagnosis not present

## 2020-04-23 DIAGNOSIS — S0091XA Abrasion of unspecified part of head, initial encounter: Secondary | ICD-10-CM | POA: Diagnosis not present

## 2020-04-23 DIAGNOSIS — M5030 Other cervical disc degeneration, unspecified cervical region: Secondary | ICD-10-CM | POA: Diagnosis not present

## 2020-04-23 DIAGNOSIS — M852 Hyperostosis of skull: Secondary | ICD-10-CM | POA: Diagnosis not present

## 2020-04-23 DIAGNOSIS — S0990XA Unspecified injury of head, initial encounter: Secondary | ICD-10-CM | POA: Diagnosis not present

## 2020-04-23 DIAGNOSIS — M50323 Other cervical disc degeneration at C6-C7 level: Secondary | ICD-10-CM | POA: Diagnosis not present

## 2020-04-23 DIAGNOSIS — M50322 Other cervical disc degeneration at C5-C6 level: Secondary | ICD-10-CM | POA: Diagnosis not present

## 2020-04-23 DIAGNOSIS — E119 Type 2 diabetes mellitus without complications: Secondary | ICD-10-CM | POA: Diagnosis not present

## 2020-04-23 DIAGNOSIS — S199XXA Unspecified injury of neck, initial encounter: Secondary | ICD-10-CM | POA: Diagnosis not present

## 2020-04-23 DIAGNOSIS — W06XXXA Fall from bed, initial encounter: Secondary | ICD-10-CM | POA: Diagnosis not present

## 2020-05-03 DIAGNOSIS — I129 Hypertensive chronic kidney disease with stage 1 through stage 4 chronic kidney disease, or unspecified chronic kidney disease: Secondary | ICD-10-CM | POA: Diagnosis not present

## 2020-05-03 DIAGNOSIS — R1310 Dysphagia, unspecified: Secondary | ICD-10-CM | POA: Diagnosis not present

## 2020-05-03 DIAGNOSIS — Z20822 Contact with and (suspected) exposure to covid-19: Secondary | ICD-10-CM | POA: Diagnosis not present

## 2020-05-03 DIAGNOSIS — R52 Pain, unspecified: Secondary | ICD-10-CM | POA: Diagnosis not present

## 2020-05-03 DIAGNOSIS — M6281 Muscle weakness (generalized): Secondary | ICD-10-CM | POA: Diagnosis not present

## 2020-05-03 DIAGNOSIS — E1122 Type 2 diabetes mellitus with diabetic chronic kidney disease: Secondary | ICD-10-CM | POA: Diagnosis not present

## 2020-05-03 DIAGNOSIS — I6782 Cerebral ischemia: Secondary | ICD-10-CM | POA: Diagnosis not present

## 2020-05-03 DIAGNOSIS — E869 Volume depletion, unspecified: Secondary | ICD-10-CM | POA: Diagnosis not present

## 2020-05-03 DIAGNOSIS — Z9181 History of falling: Secondary | ICD-10-CM | POA: Diagnosis not present

## 2020-05-03 DIAGNOSIS — E119 Type 2 diabetes mellitus without complications: Secondary | ICD-10-CM | POA: Diagnosis not present

## 2020-05-03 DIAGNOSIS — K7460 Unspecified cirrhosis of liver: Secondary | ICD-10-CM | POA: Diagnosis not present

## 2020-05-03 DIAGNOSIS — R9431 Abnormal electrocardiogram [ECG] [EKG]: Secondary | ICD-10-CM | POA: Diagnosis not present

## 2020-05-03 DIAGNOSIS — D731 Hypersplenism: Secondary | ICD-10-CM | POA: Diagnosis not present

## 2020-05-03 DIAGNOSIS — J3489 Other specified disorders of nose and nasal sinuses: Secondary | ICD-10-CM | POA: Diagnosis not present

## 2020-05-03 DIAGNOSIS — J0120 Acute ethmoidal sinusitis, unspecified: Secondary | ICD-10-CM | POA: Diagnosis not present

## 2020-05-03 DIAGNOSIS — J322 Chronic ethmoidal sinusitis: Secondary | ICD-10-CM | POA: Diagnosis not present

## 2020-05-03 DIAGNOSIS — R262 Difficulty in walking, not elsewhere classified: Secondary | ICD-10-CM | POA: Diagnosis not present

## 2020-05-03 DIAGNOSIS — N183 Chronic kidney disease, stage 3 unspecified: Secondary | ICD-10-CM | POA: Diagnosis not present

## 2020-05-03 DIAGNOSIS — Z853 Personal history of malignant neoplasm of breast: Secondary | ICD-10-CM | POA: Diagnosis not present

## 2020-05-03 DIAGNOSIS — G459 Transient cerebral ischemic attack, unspecified: Secondary | ICD-10-CM | POA: Diagnosis not present

## 2020-05-03 DIAGNOSIS — N189 Chronic kidney disease, unspecified: Secondary | ICD-10-CM | POA: Diagnosis not present

## 2020-05-03 DIAGNOSIS — Z743 Need for continuous supervision: Secondary | ICD-10-CM | POA: Diagnosis not present

## 2020-05-03 DIAGNOSIS — R404 Transient alteration of awareness: Secondary | ICD-10-CM | POA: Diagnosis not present

## 2020-05-03 DIAGNOSIS — D6959 Other secondary thrombocytopenia: Secondary | ICD-10-CM | POA: Diagnosis not present

## 2020-05-03 DIAGNOSIS — J9811 Atelectasis: Secondary | ICD-10-CM | POA: Diagnosis not present

## 2020-05-03 DIAGNOSIS — R531 Weakness: Secondary | ICD-10-CM | POA: Diagnosis not present

## 2020-06-21 DIAGNOSIS — E038 Other specified hypothyroidism: Secondary | ICD-10-CM | POA: Diagnosis not present

## 2020-06-21 DIAGNOSIS — E1143 Type 2 diabetes mellitus with diabetic autonomic (poly)neuropathy: Secondary | ICD-10-CM | POA: Diagnosis not present

## 2020-06-21 DIAGNOSIS — I10 Essential (primary) hypertension: Secondary | ICD-10-CM | POA: Diagnosis not present

## 2020-06-21 DIAGNOSIS — N182 Chronic kidney disease, stage 2 (mild): Secondary | ICD-10-CM | POA: Diagnosis not present

## 2020-06-21 DIAGNOSIS — K7469 Other cirrhosis of liver: Secondary | ICD-10-CM | POA: Diagnosis not present

## 2020-06-21 DIAGNOSIS — I872 Venous insufficiency (chronic) (peripheral): Secondary | ICD-10-CM | POA: Diagnosis not present

## 2020-07-05 DIAGNOSIS — E119 Type 2 diabetes mellitus without complications: Secondary | ICD-10-CM | POA: Diagnosis not present

## 2020-07-05 DIAGNOSIS — K7460 Unspecified cirrhosis of liver: Secondary | ICD-10-CM | POA: Diagnosis not present

## 2020-07-05 DIAGNOSIS — R0989 Other specified symptoms and signs involving the circulatory and respiratory systems: Secondary | ICD-10-CM | POA: Diagnosis not present

## 2020-07-05 DIAGNOSIS — I872 Venous insufficiency (chronic) (peripheral): Secondary | ICD-10-CM | POA: Diagnosis not present

## 2020-07-06 DIAGNOSIS — I872 Venous insufficiency (chronic) (peripheral): Secondary | ICD-10-CM | POA: Diagnosis not present

## 2020-07-06 DIAGNOSIS — E119 Type 2 diabetes mellitus without complications: Secondary | ICD-10-CM | POA: Diagnosis not present

## 2020-07-06 DIAGNOSIS — K7460 Unspecified cirrhosis of liver: Secondary | ICD-10-CM | POA: Diagnosis not present

## 2020-07-06 DIAGNOSIS — R278 Other lack of coordination: Secondary | ICD-10-CM | POA: Diagnosis not present

## 2020-07-07 DIAGNOSIS — Z79899 Other long term (current) drug therapy: Secondary | ICD-10-CM | POA: Diagnosis not present

## 2020-07-07 DIAGNOSIS — E559 Vitamin D deficiency, unspecified: Secondary | ICD-10-CM | POA: Diagnosis not present

## 2020-07-07 DIAGNOSIS — D649 Anemia, unspecified: Secondary | ICD-10-CM | POA: Diagnosis not present

## 2020-07-07 DIAGNOSIS — E119 Type 2 diabetes mellitus without complications: Secondary | ICD-10-CM | POA: Diagnosis not present

## 2020-07-07 DIAGNOSIS — E039 Hypothyroidism, unspecified: Secondary | ICD-10-CM | POA: Diagnosis not present

## 2020-07-07 DIAGNOSIS — R278 Other lack of coordination: Secondary | ICD-10-CM | POA: Diagnosis not present

## 2020-07-08 DIAGNOSIS — R278 Other lack of coordination: Secondary | ICD-10-CM | POA: Diagnosis not present

## 2020-07-11 DIAGNOSIS — R278 Other lack of coordination: Secondary | ICD-10-CM | POA: Diagnosis not present

## 2020-07-12 DIAGNOSIS — R278 Other lack of coordination: Secondary | ICD-10-CM | POA: Diagnosis not present

## 2020-07-12 DIAGNOSIS — E039 Hypothyroidism, unspecified: Secondary | ICD-10-CM | POA: Diagnosis not present

## 2020-07-12 DIAGNOSIS — N189 Chronic kidney disease, unspecified: Secondary | ICD-10-CM | POA: Diagnosis not present

## 2020-07-12 DIAGNOSIS — Z794 Long term (current) use of insulin: Secondary | ICD-10-CM | POA: Diagnosis not present

## 2020-07-12 DIAGNOSIS — E119 Type 2 diabetes mellitus without complications: Secondary | ICD-10-CM | POA: Diagnosis not present

## 2020-07-12 DIAGNOSIS — E559 Vitamin D deficiency, unspecified: Secondary | ICD-10-CM | POA: Diagnosis not present

## 2020-07-12 DIAGNOSIS — R7989 Other specified abnormal findings of blood chemistry: Secondary | ICD-10-CM | POA: Diagnosis not present

## 2020-07-12 DIAGNOSIS — D649 Anemia, unspecified: Secondary | ICD-10-CM | POA: Diagnosis not present

## 2020-07-12 DIAGNOSIS — E785 Hyperlipidemia, unspecified: Secondary | ICD-10-CM | POA: Diagnosis not present

## 2020-07-13 DIAGNOSIS — R278 Other lack of coordination: Secondary | ICD-10-CM | POA: Diagnosis not present

## 2020-07-14 DIAGNOSIS — R278 Other lack of coordination: Secondary | ICD-10-CM | POA: Diagnosis not present

## 2020-07-15 DIAGNOSIS — R278 Other lack of coordination: Secondary | ICD-10-CM | POA: Diagnosis not present

## 2020-07-18 DIAGNOSIS — R278 Other lack of coordination: Secondary | ICD-10-CM | POA: Diagnosis not present

## 2020-07-19 DIAGNOSIS — R278 Other lack of coordination: Secondary | ICD-10-CM | POA: Diagnosis not present

## 2020-07-20 DIAGNOSIS — R278 Other lack of coordination: Secondary | ICD-10-CM | POA: Diagnosis not present

## 2020-07-21 DIAGNOSIS — R278 Other lack of coordination: Secondary | ICD-10-CM | POA: Diagnosis not present

## 2020-07-22 DIAGNOSIS — R278 Other lack of coordination: Secondary | ICD-10-CM | POA: Diagnosis not present

## 2020-07-23 DIAGNOSIS — R278 Other lack of coordination: Secondary | ICD-10-CM | POA: Diagnosis not present

## 2020-07-25 DIAGNOSIS — R278 Other lack of coordination: Secondary | ICD-10-CM | POA: Diagnosis not present

## 2020-07-26 DIAGNOSIS — R278 Other lack of coordination: Secondary | ICD-10-CM | POA: Diagnosis not present

## 2020-07-27 DIAGNOSIS — R278 Other lack of coordination: Secondary | ICD-10-CM | POA: Diagnosis not present

## 2020-07-28 DIAGNOSIS — R278 Other lack of coordination: Secondary | ICD-10-CM | POA: Diagnosis not present

## 2020-07-29 DIAGNOSIS — R278 Other lack of coordination: Secondary | ICD-10-CM | POA: Diagnosis not present

## 2020-07-30 DIAGNOSIS — R278 Other lack of coordination: Secondary | ICD-10-CM | POA: Diagnosis not present

## 2020-08-01 DIAGNOSIS — R278 Other lack of coordination: Secondary | ICD-10-CM | POA: Diagnosis not present

## 2020-08-02 DIAGNOSIS — E039 Hypothyroidism, unspecified: Secondary | ICD-10-CM | POA: Diagnosis not present

## 2020-08-02 DIAGNOSIS — R278 Other lack of coordination: Secondary | ICD-10-CM | POA: Diagnosis not present

## 2020-08-02 DIAGNOSIS — E119 Type 2 diabetes mellitus without complications: Secondary | ICD-10-CM | POA: Diagnosis not present

## 2020-08-02 DIAGNOSIS — I10 Essential (primary) hypertension: Secondary | ICD-10-CM | POA: Diagnosis not present

## 2020-08-02 DIAGNOSIS — Z794 Long term (current) use of insulin: Secondary | ICD-10-CM | POA: Diagnosis not present

## 2020-08-03 DIAGNOSIS — R278 Other lack of coordination: Secondary | ICD-10-CM | POA: Diagnosis not present

## 2020-08-04 DIAGNOSIS — R278 Other lack of coordination: Secondary | ICD-10-CM | POA: Diagnosis not present

## 2020-08-04 DIAGNOSIS — R509 Fever, unspecified: Secondary | ICD-10-CM | POA: Diagnosis not present

## 2020-08-05 DIAGNOSIS — J9811 Atelectasis: Secondary | ICD-10-CM | POA: Diagnosis not present

## 2020-08-05 DIAGNOSIS — R278 Other lack of coordination: Secondary | ICD-10-CM | POA: Diagnosis not present

## 2020-08-06 DIAGNOSIS — R278 Other lack of coordination: Secondary | ICD-10-CM | POA: Diagnosis not present

## 2020-08-06 DIAGNOSIS — Z79899 Other long term (current) drug therapy: Secondary | ICD-10-CM | POA: Diagnosis not present

## 2020-08-08 DIAGNOSIS — R278 Other lack of coordination: Secondary | ICD-10-CM | POA: Diagnosis not present

## 2020-08-09 DIAGNOSIS — R278 Other lack of coordination: Secondary | ICD-10-CM | POA: Diagnosis not present

## 2020-08-10 DIAGNOSIS — R278 Other lack of coordination: Secondary | ICD-10-CM | POA: Diagnosis not present

## 2020-08-10 DIAGNOSIS — R451 Restlessness and agitation: Secondary | ICD-10-CM | POA: Diagnosis not present

## 2020-08-10 DIAGNOSIS — R300 Dysuria: Secondary | ICD-10-CM | POA: Diagnosis not present

## 2020-08-11 DIAGNOSIS — R278 Other lack of coordination: Secondary | ICD-10-CM | POA: Diagnosis not present

## 2020-08-12 DIAGNOSIS — R278 Other lack of coordination: Secondary | ICD-10-CM | POA: Diagnosis not present

## 2020-08-13 DIAGNOSIS — R278 Other lack of coordination: Secondary | ICD-10-CM | POA: Diagnosis not present

## 2020-08-14 DIAGNOSIS — R278 Other lack of coordination: Secondary | ICD-10-CM | POA: Diagnosis not present

## 2020-08-15 DIAGNOSIS — R278 Other lack of coordination: Secondary | ICD-10-CM | POA: Diagnosis not present

## 2020-08-16 DIAGNOSIS — D649 Anemia, unspecified: Secondary | ICD-10-CM | POA: Diagnosis not present

## 2020-08-16 DIAGNOSIS — R278 Other lack of coordination: Secondary | ICD-10-CM | POA: Diagnosis not present

## 2020-08-17 DIAGNOSIS — R278 Other lack of coordination: Secondary | ICD-10-CM | POA: Diagnosis not present

## 2020-08-18 DIAGNOSIS — R278 Other lack of coordination: Secondary | ICD-10-CM | POA: Diagnosis not present

## 2020-08-19 DIAGNOSIS — R278 Other lack of coordination: Secondary | ICD-10-CM | POA: Diagnosis not present

## 2020-08-22 DIAGNOSIS — R278 Other lack of coordination: Secondary | ICD-10-CM | POA: Diagnosis not present

## 2020-08-23 DIAGNOSIS — R278 Other lack of coordination: Secondary | ICD-10-CM | POA: Diagnosis not present

## 2020-08-24 DIAGNOSIS — E039 Hypothyroidism, unspecified: Secondary | ICD-10-CM | POA: Diagnosis not present

## 2020-08-24 DIAGNOSIS — R278 Other lack of coordination: Secondary | ICD-10-CM | POA: Diagnosis not present

## 2020-08-25 DIAGNOSIS — R278 Other lack of coordination: Secondary | ICD-10-CM | POA: Diagnosis not present

## 2020-08-26 DIAGNOSIS — R278 Other lack of coordination: Secondary | ICD-10-CM | POA: Diagnosis not present

## 2020-08-27 DIAGNOSIS — R278 Other lack of coordination: Secondary | ICD-10-CM | POA: Diagnosis not present

## 2020-08-29 DIAGNOSIS — R278 Other lack of coordination: Secondary | ICD-10-CM | POA: Diagnosis not present

## 2020-08-30 DIAGNOSIS — R634 Abnormal weight loss: Secondary | ICD-10-CM | POA: Diagnosis not present

## 2020-08-30 DIAGNOSIS — R5383 Other fatigue: Secondary | ICD-10-CM | POA: Diagnosis not present

## 2020-08-30 DIAGNOSIS — R10819 Abdominal tenderness, unspecified site: Secondary | ICD-10-CM | POA: Diagnosis not present

## 2020-08-30 DIAGNOSIS — R278 Other lack of coordination: Secondary | ICD-10-CM | POA: Diagnosis not present

## 2020-08-31 DIAGNOSIS — R278 Other lack of coordination: Secondary | ICD-10-CM | POA: Diagnosis not present

## 2020-08-31 DIAGNOSIS — E860 Dehydration: Secondary | ICD-10-CM | POA: Diagnosis not present

## 2020-08-31 DIAGNOSIS — R5383 Other fatigue: Secondary | ICD-10-CM | POA: Diagnosis not present

## 2020-09-01 DIAGNOSIS — R278 Other lack of coordination: Secondary | ICD-10-CM | POA: Diagnosis not present

## 2020-09-01 DIAGNOSIS — N189 Chronic kidney disease, unspecified: Secondary | ICD-10-CM | POA: Diagnosis not present

## 2020-09-07 DIAGNOSIS — D649 Anemia, unspecified: Secondary | ICD-10-CM | POA: Diagnosis not present

## 2020-09-13 DIAGNOSIS — D649 Anemia, unspecified: Secondary | ICD-10-CM | POA: Diagnosis not present

## 2020-09-13 DIAGNOSIS — K921 Melena: Secondary | ICD-10-CM | POA: Diagnosis not present

## 2020-09-13 DIAGNOSIS — Z794 Long term (current) use of insulin: Secondary | ICD-10-CM | POA: Diagnosis not present

## 2020-09-13 DIAGNOSIS — E119 Type 2 diabetes mellitus without complications: Secondary | ICD-10-CM | POA: Diagnosis not present

## 2020-09-21 DIAGNOSIS — D649 Anemia, unspecified: Secondary | ICD-10-CM | POA: Diagnosis not present

## 2020-09-23 DIAGNOSIS — E1121 Type 2 diabetes mellitus with diabetic nephropathy: Secondary | ICD-10-CM | POA: Diagnosis not present

## 2020-09-26 DIAGNOSIS — R1312 Dysphagia, oropharyngeal phase: Secondary | ICD-10-CM | POA: Diagnosis not present

## 2020-09-26 DIAGNOSIS — E1121 Type 2 diabetes mellitus with diabetic nephropathy: Secondary | ICD-10-CM | POA: Diagnosis not present

## 2020-09-26 DIAGNOSIS — M6281 Muscle weakness (generalized): Secondary | ICD-10-CM | POA: Diagnosis not present

## 2020-09-27 DIAGNOSIS — M6281 Muscle weakness (generalized): Secondary | ICD-10-CM | POA: Diagnosis not present

## 2020-09-27 DIAGNOSIS — E1121 Type 2 diabetes mellitus with diabetic nephropathy: Secondary | ICD-10-CM | POA: Diagnosis not present

## 2020-09-27 DIAGNOSIS — R1312 Dysphagia, oropharyngeal phase: Secondary | ICD-10-CM | POA: Diagnosis not present

## 2020-09-27 DIAGNOSIS — I10 Essential (primary) hypertension: Secondary | ICD-10-CM | POA: Diagnosis not present

## 2020-09-27 DIAGNOSIS — E039 Hypothyroidism, unspecified: Secondary | ICD-10-CM | POA: Diagnosis not present

## 2020-09-27 DIAGNOSIS — E119 Type 2 diabetes mellitus without complications: Secondary | ICD-10-CM | POA: Diagnosis not present

## 2020-09-28 DIAGNOSIS — R1312 Dysphagia, oropharyngeal phase: Secondary | ICD-10-CM | POA: Diagnosis not present

## 2020-09-28 DIAGNOSIS — M6281 Muscle weakness (generalized): Secondary | ICD-10-CM | POA: Diagnosis not present

## 2020-09-28 DIAGNOSIS — E1121 Type 2 diabetes mellitus with diabetic nephropathy: Secondary | ICD-10-CM | POA: Diagnosis not present

## 2020-09-29 DIAGNOSIS — R1312 Dysphagia, oropharyngeal phase: Secondary | ICD-10-CM | POA: Diagnosis not present

## 2020-09-29 DIAGNOSIS — M6281 Muscle weakness (generalized): Secondary | ICD-10-CM | POA: Diagnosis not present

## 2020-09-29 DIAGNOSIS — E1121 Type 2 diabetes mellitus with diabetic nephropathy: Secondary | ICD-10-CM | POA: Diagnosis not present

## 2020-09-29 DIAGNOSIS — E119 Type 2 diabetes mellitus without complications: Secondary | ICD-10-CM | POA: Diagnosis not present

## 2020-09-30 DIAGNOSIS — M6281 Muscle weakness (generalized): Secondary | ICD-10-CM | POA: Diagnosis not present

## 2020-09-30 DIAGNOSIS — E1121 Type 2 diabetes mellitus with diabetic nephropathy: Secondary | ICD-10-CM | POA: Diagnosis not present

## 2020-09-30 DIAGNOSIS — R1312 Dysphagia, oropharyngeal phase: Secondary | ICD-10-CM | POA: Diagnosis not present

## 2020-10-03 DIAGNOSIS — M6281 Muscle weakness (generalized): Secondary | ICD-10-CM | POA: Diagnosis not present

## 2020-10-03 DIAGNOSIS — E1121 Type 2 diabetes mellitus with diabetic nephropathy: Secondary | ICD-10-CM | POA: Diagnosis not present

## 2020-10-03 DIAGNOSIS — R1312 Dysphagia, oropharyngeal phase: Secondary | ICD-10-CM | POA: Diagnosis not present

## 2020-10-04 ENCOUNTER — Other Ambulatory Visit: Payer: Self-pay

## 2020-10-04 ENCOUNTER — Ambulatory Visit (INDEPENDENT_AMBULATORY_CARE_PROVIDER_SITE_OTHER): Payer: Medicare Other | Admitting: Gastroenterology

## 2020-10-04 ENCOUNTER — Telehealth: Payer: Self-pay | Admitting: Gastroenterology

## 2020-10-04 ENCOUNTER — Encounter: Payer: Self-pay | Admitting: Gastroenterology

## 2020-10-04 VITALS — BP 86/53 | HR 83 | Ht 63.0 in | Wt 119.0 lb

## 2020-10-04 DIAGNOSIS — R195 Other fecal abnormalities: Secondary | ICD-10-CM | POA: Diagnosis not present

## 2020-10-04 DIAGNOSIS — I8510 Secondary esophageal varices without bleeding: Secondary | ICD-10-CM | POA: Diagnosis not present

## 2020-10-04 DIAGNOSIS — I8500 Esophageal varices without bleeding: Secondary | ICD-10-CM

## 2020-10-04 DIAGNOSIS — K7460 Unspecified cirrhosis of liver: Secondary | ICD-10-CM

## 2020-10-04 DIAGNOSIS — D649 Anemia, unspecified: Secondary | ICD-10-CM | POA: Insufficient documentation

## 2020-10-04 DIAGNOSIS — R634 Abnormal weight loss: Secondary | ICD-10-CM | POA: Diagnosis not present

## 2020-10-04 DIAGNOSIS — E039 Hypothyroidism, unspecified: Secondary | ICD-10-CM | POA: Diagnosis not present

## 2020-10-04 DIAGNOSIS — R5383 Other fatigue: Secondary | ICD-10-CM | POA: Diagnosis not present

## 2020-10-04 DIAGNOSIS — E1121 Type 2 diabetes mellitus with diabetic nephropathy: Secondary | ICD-10-CM | POA: Diagnosis not present

## 2020-10-04 DIAGNOSIS — I959 Hypotension, unspecified: Secondary | ICD-10-CM | POA: Diagnosis not present

## 2020-10-04 DIAGNOSIS — R1312 Dysphagia, oropharyngeal phase: Secondary | ICD-10-CM | POA: Diagnosis not present

## 2020-10-04 DIAGNOSIS — M6281 Muscle weakness (generalized): Secondary | ICD-10-CM | POA: Diagnosis not present

## 2020-10-04 DIAGNOSIS — N189 Chronic kidney disease, unspecified: Secondary | ICD-10-CM | POA: Diagnosis not present

## 2020-10-04 NOTE — Telephone Encounter (Signed)
Patient seen in the office today, presented from the nursing home.  Seen for heme positive stool.  I tried to call nursing home to discuss patient with nurse.  I was provided [employer], left message for return call.  I am concerned about her mental status when she presented today.  She was lethargic, verbally unresponsive.  Minimally responsive to physical stimuli.  I am not sure what her current baseline has been but this is far from where she was when we saw her a couple of years ago.  Given her cirrhosis I am concerned about hepatic encephalopathy.   Please reach out to patient's nurse today.  Find out whether she has had any black or bloody stools.  When was the last 1 if she has had black stools?  What has her mental status usually i.e. she normally awake and alert?  1. I have ordered labs to be done today. 2. Lets have her start lactulose 2 tablespoons twice daily, goal of 3-4 stools per day. 3. ER precautions, she should go to the ER if they see further black stools, patient is a less alert than her baseline.

## 2020-10-04 NOTE — Telephone Encounter (Signed)
[facility] facility. I was transferred twice to the nurses station that continue to ring. I will call back. Not able to speak with anyone, no answering machine picked up.

## 2020-10-04 NOTE — Progress Notes (Signed)
Cc'ed to [facility] °

## 2020-10-04 NOTE — Patient Instructions (Signed)
1. Please call with information regarding the patient. Have there been any obvious blood in stool or black stools? Is she normal alert? Patient was barely arousable during her office visit and staff with her did not know what her baseline is.  2. Please complete labs today. Send results to fax [phone].

## 2020-10-04 NOTE — Progress Notes (Signed)
Primary Care Physician: Excell Seltzer, MD at [facility]  Primary Gastroenterologist:  Garfield Cornea, MD   Chief Complaint  Patient presents with  . positive hemoccult    HPI:  Melissa Rose is a 80 y.o. female with h/o Afib, GERD, DM, Breast cancer, hypothyroidism, Htn, Cirrhosis here at the request of Dr. Excell Seltzer for further evaluation of heme positive stool.  Currently resides at [facility] center, admitted October 2021.  Patient was last seen in 2018.  She has a history of cirrhosis and esophageal varices with previous banding.    Presents today for further evaluation of heme positive stool.  Patient presents with transportation staff.  They are unfamiliar with the patient.  Patient is unable to provide any history.  In fact she only responded minimally to touch.  Never opened her eyes during exam.  We know the patient from past management of her cirrhosis but unfortunately she was lost to follow-up in late 2018.  Review of records provided by the nursing home and through care everywhere showed admission at [facility] for altered mental status in August 2021.  CT and MRI brain with no acute findings although limited exam due to motion degradation.  Ammonia level was elevated at 97.  No records or indication that she was treated with lactulose, not on her discharge papers either.  White blood cell count was 3400, hemoglobin 8.9, platelet 55,000.  Albumin 2.2, total bilirubin 2.2, alk phos 95, AST 37, ALT 27, INR 1.45, A1c 9.3.  She was also admitted to the hospital within the [facility] system in January 2021 when she presented with chest pain and leg pain.  Found to have new onset A. Fib. Due to risk of GI bleeding being higher than prevention of stroke, it was advised against anticoagulation.  Most recent labs includes A1c of 5.8 on September 29, 2020, labs from December 29 with white blood cell count of 3400, hemoglobin 10.9, hematocrit 33.7, MCV 92,  platelets 53,000.  Labs from September 07, 2020 with white blood cell count 6000, hemoglobin 11.2 normal, hematocrit 34.3, platelets 76,000. ?report of melena, stool heme + X 2.   Pertinent GI  Last EGD September 2018: Single column of grade 3 varices, 3 columns of grade 2 varices, portal hypertensive gastropathy in the stomach.  Acquired stenosis found in the first part of the duodenum which was easily traversed by the scope.  Varices were banded 3 times in 2018.  Last Colonoscopy March 2018 by Dr. Ladona Horns: normal.    Per nursing home papers her primary emergency contact is her daughter, Melissa Rose who lives in [place].  Cell phone [phone], office [phone].  Secondary emergency contact spouse, Melissa Rose, who resides in [place].  Cell phone [phone], home [phone].   Current Outpatient Medications  Medication Sig Dispense Refill  . anastrozole (ARIMIDEX) 1 MG tablet Take 1 mg by mouth daily.    Marland Kitchen atorvastatin (LIPITOR) 10 MG tablet Take 10 mg by mouth at bedtime.     . Calcium 600-200 MG-UNIT tablet Take 1 tablet by mouth 2 (two) times daily.    . Cholecalciferol (VITAMIN D3) 125 MCG (5000 UT) TABS Take 1 tablet by mouth daily.    . colestipol (COLESTID) 1 g tablet Take 1 g by mouth daily.    Marland Kitchen donepezil (ARICEPT) 10 MG tablet Take 10 mg by mouth every evening.     . gabapentin (NEURONTIN) 100 MG capsule Take 200 mg by mouth at bedtime.    Marland Kitchen  glipiZIDE (GLUCOTROL) 5 MG tablet Take 5 mg by mouth 2 (two) times daily.    . hydrocortisone cream (PREPARATION H) 1 % Apply 1 application topically as needed.    . insulin glargine (LANTUS) 100 UNIT/ML injection Inject 16 Units into the skin at bedtime.    . insulin lispro (HUMALOG) 100 UNIT/ML injection Inject 2-10 Units into the skin 4 (four) times daily.    Marland Kitchen levothyroxine (SYNTHROID) 175 MCG tablet Take 175 mcg by mouth daily before breakfast.    . lisinopril (PRINIVIL,ZESTRIL) 10 MG tablet Take 10 mg by mouth daily.    Marland Kitchen  LORazepam (ATIVAN) 0.5 MG tablet Take 0.5 mg by mouth as needed for anxiety.    . mirtazapine (REMERON) 15 MG tablet Take 15 mg by mouth at bedtime.    Marland Kitchen spironolactone (ALDACTONE) 25 MG tablet Take 12.5 mg by mouth 2 (two) times daily.     No current facility-administered medications for this visit.    Allergies as of 10/04/2020  . (No Known Allergies)    Past Medical History:  Diagnosis Date  . A-fib ([place])   . Arthritis   . Breast cancer ([place]) 2000   treated with chemo/radiation  . Cirrhosis ([place])   . Dementia ([place])    slight  . Diabetes mellitus without complication ([place])   . GERD (gastroesophageal reflux disease)   . Hyperlipidemia   . Hypertension   . Hypothyroidism     Past Surgical History:  Procedure Laterality Date  . BREAST SURGERY Right   . 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 COLONOSCOPY  11/2016   Dr. Ladona Horns: normal  . ESOPHAGEAL BANDING N/A 02/13/2017   Procedure: ESOPHAGEAL BANDING;  Surgeon: Daneil Dolin, MD;  [facility];  Service: Endoscopy;  Laterality: N/A;  . ESOPHAGEAL BANDING N/A 03/25/2017   Procedure: ESOPHAGEAL BANDING;  Surgeon: Daneil Dolin, MD;  [facility];  Service: Endoscopy;  Laterality: N/A;  Esophageal varices banding  . ESOPHAGEAL BANDING N/A 06/03/2017   Procedure: ESOPHAGEAL BANDING;  Surgeon: Daneil Dolin, MD;  [facility];  Service: Endoscopy;  Laterality: N/A;  varices  . ESOPHAGOGASTRODUODENOSCOPY  11/2016   Dr. 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  . ESOPHAGOGASTRODUODENOSCOPY (EGD) WITH PROPOFOL N/A 02/13/2017   Procedure: ESOPHAGOGASTRODUODENOSCOPY (EGD) WITH PROPOFOL;  Surgeon: Daneil Dolin, MD;  [facility];  Service: Endoscopy;  Laterality: N/A;  145   . ESOPHAGOGASTRODUODENOSCOPY (EGD) WITH PROPOFOL  N/A 03/25/2017   Procedure: ESOPHAGOGASTRODUODENOSCOPY (EGD) WITH PROPOFOL;  Surgeon: Daneil Dolin, MD;  [facility];  Service: Endoscopy;  Laterality: N/A;  7:30am  . ESOPHAGOGASTRODUODENOSCOPY (EGD) WITH PROPOFOL N/A 06/03/2017   Procedure: ESOPHAGOGASTRODUODENOSCOPY (EGD) WITH PROPOFOL;  Surgeon: Daneil Dolin, MD;  [facility];  Service: Endoscopy;  Laterality: N/A;  8:30am    Family History  Problem Relation Age of Onset  . Diabetes Mother   . Diabetes Father   . Heart disease Father   . Cerebral palsy Sister   . Liver disease Neg Hx   . Colon cancer Neg Hx     Social History   Socioeconomic History  . Marital status: Married    Spouse name: Not on file  . Number of children: Not on file  . Years of education: Not on file  .  Highest education level: Not on file  Occupational History  . Not on file  Tobacco Use  . Smoking status: Never Smoker  . Smokeless tobacco: Never Used  Vaping Use  . Vaping Use: Never used  Substance and Sexual Activity  . Alcohol use: No  . Drug use: No  . Sexual activity: Not Currently    Birth control/protection: None  Other Topics Concern  . Not on file  Social History Narrative  . Not on file   Social Determinants of Health   Financial Resource Strain: Not on file  Food Insecurity: Not on file  Transportation Needs: Not on file  Physical Activity: Not on file  Stress: Not on file  Social Connections: Not on file  Intimate Partner Violence: Not on file      ROS:  Unobtainable   Physical Examination:  BP (!) 86/53   Ht 5' 3"  (1.6 m)   Wt 119 lb (54 kg) Comment: per nursing home record 09/30/20  BMI 21.08 kg/m    General: Frail, lethargic. Minimally responsive to touch. Patient in rolled recliner. Head: Normocephalic, atraumatic.   Eyes: Conjunctiva pink, no icterus. Mouth: Oropharyngeal mucosa dry Neck: Supple without thyromegaly, masses, or lymphadenopathy.  Lungs: Clear to auscultation  bilaterally.  Heart: Regular rate and rhythm, no murmurs rubs or gallops.  Abdomen: Bowel sounds are normal, nontender, nondistended, no hepatosplenomegaly or masses, no abdominal bruits or    hernia , no rebound or guarding.   Rectal: not performed Extremities: No lower extremity edema. No clubbing or deformities.  Neuro: Alert and oriented x 4 , grossly normal neurologically.  Skin: Warm and dry, no rash or jaundice.   Psych: Alert and cooperative, normal mood and affect.  Labs: Lab Results  Component Value Date   CREATININE 1.45 (H) 09/30/2019   BUN 12 09/30/2019   NA 139 09/30/2019   K 3.2 (L) 09/30/2019   CL 109 09/30/2019   CO2 17 (L) 09/30/2019   Lab Results  Component Value Date   ALT 26 09/30/2019   AST 40 09/30/2019   ALKPHOS 114 09/30/2019   BILITOT 1.1 09/30/2019   Lab Results  Component Value Date   WBC 2.5 (L) 09/30/2019   HGB 10.2 (L) 09/30/2019   HCT 33.3 (L) 09/30/2019   MCV 88.8 09/30/2019   PLT 54 (L) 09/30/2019   Lab Results  Component Value Date   HGBA1C 9.6 (H) 09/30/2019   Lab Results  Component Value Date   TSH 10.663 (H) 09/30/2019   Lab Results  Component Value Date   INR 1.5 (H) 09/30/2019   INR 1.29 11/06/2017   INR 1.2 04/19/2017     Imaging Studies: No results found.  Impression/Plan:  80 y/o female with multiple comorbidities as outlined above, including cirrhosis with history of esophageal varices, presents for heme positive stool. Patient unable to provide any history and no family present. Transportation staff unable to give pertinent information.   From the papers that accompany her from the nursing home, she had normal H/H mid 08/2020 but slight decline on recheck towards end of 08/2020. She has chronic thrombocytopenia. Stool heme +. There is a diagnosis code of melena on her papers, around the time of labs, so suspect patient passed dark stool recently. Her BP is soft normal in the office, with normal pulse. I doubt we are  dealing with acute variceal bleed. She may have stuttering ugi bleed from portal hypertension gastropathy or pud. She will likely require upper endoscopy to evaluate  but urgency has not been determined yet. Will also need to touch base with family regarding their wishes.   Unfortunately I am unaware of her baseline mental status and she carries diagnosis of dementia but given her cirrhosis, I would be concerned about hepatic encephalopathy contributing to her current condition. We have requested for nursing staff to call with further information about the patient's condition. I have ordered labs for today. Again further recommendations to follow once more information has been obtained from staff.  I called the nursing home and requested to speak to patient's nurse. I was forward to [employer] and I left a message.

## 2020-10-05 DIAGNOSIS — E039 Hypothyroidism, unspecified: Secondary | ICD-10-CM | POA: Diagnosis not present

## 2020-10-05 DIAGNOSIS — E860 Dehydration: Secondary | ICD-10-CM | POA: Diagnosis not present

## 2020-10-05 DIAGNOSIS — E1121 Type 2 diabetes mellitus with diabetic nephropathy: Secondary | ICD-10-CM | POA: Diagnosis not present

## 2020-10-05 DIAGNOSIS — M6281 Muscle weakness (generalized): Secondary | ICD-10-CM | POA: Diagnosis not present

## 2020-10-05 DIAGNOSIS — R1312 Dysphagia, oropharyngeal phase: Secondary | ICD-10-CM | POA: Diagnosis not present

## 2020-10-05 DIAGNOSIS — R5383 Other fatigue: Secondary | ICD-10-CM | POA: Diagnosis not present

## 2020-10-05 NOTE — Telephone Encounter (Signed)
Tried to call [employer].  Was transferred to nurse's station but no one ever answered.

## 2020-10-06 DIAGNOSIS — R1312 Dysphagia, oropharyngeal phase: Secondary | ICD-10-CM | POA: Diagnosis not present

## 2020-10-06 DIAGNOSIS — E1121 Type 2 diabetes mellitus with diabetic nephropathy: Secondary | ICD-10-CM | POA: Diagnosis not present

## 2020-10-06 DIAGNOSIS — M6281 Muscle weakness (generalized): Secondary | ICD-10-CM | POA: Diagnosis not present

## 2020-10-06 NOTE — Telephone Encounter (Signed)
Can we see if they did the labs I ordered?

## 2020-10-07 DIAGNOSIS — M6281 Muscle weakness (generalized): Secondary | ICD-10-CM | POA: Diagnosis not present

## 2020-10-07 DIAGNOSIS — R1312 Dysphagia, oropharyngeal phase: Secondary | ICD-10-CM | POA: Diagnosis not present

## 2020-10-07 DIAGNOSIS — E1121 Type 2 diabetes mellitus with diabetic nephropathy: Secondary | ICD-10-CM | POA: Diagnosis not present

## 2020-10-11 DIAGNOSIS — R1312 Dysphagia, oropharyngeal phase: Secondary | ICD-10-CM | POA: Diagnosis not present

## 2020-10-11 DIAGNOSIS — M6281 Muscle weakness (generalized): Secondary | ICD-10-CM | POA: Diagnosis not present

## 2020-10-11 DIAGNOSIS — E1121 Type 2 diabetes mellitus with diabetic nephropathy: Secondary | ICD-10-CM | POA: Diagnosis not present

## 2020-10-12 DIAGNOSIS — R1312 Dysphagia, oropharyngeal phase: Secondary | ICD-10-CM | POA: Diagnosis not present

## 2020-10-12 DIAGNOSIS — E1121 Type 2 diabetes mellitus with diabetic nephropathy: Secondary | ICD-10-CM | POA: Diagnosis not present

## 2020-10-12 DIAGNOSIS — M6281 Muscle weakness (generalized): Secondary | ICD-10-CM | POA: Diagnosis not present

## 2020-10-12 NOTE — Telephone Encounter (Signed)
Lmom, waiting on a return call.  

## 2020-10-13 DIAGNOSIS — R1312 Dysphagia, oropharyngeal phase: Secondary | ICD-10-CM | POA: Diagnosis not present

## 2020-10-13 DIAGNOSIS — E1121 Type 2 diabetes mellitus with diabetic nephropathy: Secondary | ICD-10-CM | POA: Diagnosis not present

## 2020-10-13 DIAGNOSIS — M6281 Muscle weakness (generalized): Secondary | ICD-10-CM | POA: Diagnosis not present

## 2020-10-13 NOTE — Telephone Encounter (Signed)
Noted  

## 2020-10-13 NOTE — Telephone Encounter (Signed)
Spoke with [facility] facility. Labs were completed and will be faxed to our office. When labs are received, they will be placed in [place] Crouch, [place] office.

## 2020-10-13 NOTE — Telephone Encounter (Signed)
Received labs from the nursing home dated October 04, 2020.  Unfortunately the labs that I requested were not done which included INR, LFTs, ammonia level.  Labs done were hemoglobin of 11.8 normal, hematocrit 34.6, platelets 47,000, white blood cell count 4500, BUN 61, creatinine 2.10, sodium 143, potassium 5.5, TSH 0.059.  Platelet count at baseline.  Normal hemoglobin, improved from January labs.  1) Melissa Rose, were you able to let them know the orders for lactulose? 2) We still need to speak with clinical staff regarding patient's condition. Is she still lethargic? And if we can get a contact number of her provider at the facility, I could call and speak with them.

## 2020-10-13 NOTE — Telephone Encounter (Signed)
Noted regarding comfort care status. No further intervention planned.

## 2020-10-13 NOTE — Telephone Encounter (Signed)
[place] and spoke with Roselyn Reef pts nurse. I asked Roselyn Reef about the Lactulose order and was told that pt has been taken off of all of her medications except for medications used for comfort. Pt has been placed on comfort care. The physician seeing pt at the facility is Excell Seltzer, MD. Roselyn Reef didn't have a phone number for him and when I asked to speak with admin, I was told there wasn't anyone that could speak at this time.

## 2020-10-14 DIAGNOSIS — E1121 Type 2 diabetes mellitus with diabetic nephropathy: Secondary | ICD-10-CM | POA: Diagnosis not present

## 2020-10-14 DIAGNOSIS — M6281 Muscle weakness (generalized): Secondary | ICD-10-CM | POA: Diagnosis not present

## 2020-10-14 DIAGNOSIS — R1312 Dysphagia, oropharyngeal phase: Secondary | ICD-10-CM | POA: Diagnosis not present

## 2020-10-15 DIAGNOSIS — E1121 Type 2 diabetes mellitus with diabetic nephropathy: Secondary | ICD-10-CM | POA: Diagnosis not present

## 2020-10-15 DIAGNOSIS — M6281 Muscle weakness (generalized): Secondary | ICD-10-CM | POA: Diagnosis not present

## 2020-10-15 DIAGNOSIS — R1312 Dysphagia, oropharyngeal phase: Secondary | ICD-10-CM | POA: Diagnosis not present

## 2020-10-17 DIAGNOSIS — E1121 Type 2 diabetes mellitus with diabetic nephropathy: Secondary | ICD-10-CM | POA: Diagnosis not present

## 2020-10-17 DIAGNOSIS — R1312 Dysphagia, oropharyngeal phase: Secondary | ICD-10-CM | POA: Diagnosis not present

## 2020-10-17 DIAGNOSIS — M6281 Muscle weakness (generalized): Secondary | ICD-10-CM | POA: Diagnosis not present

## 2020-10-18 DIAGNOSIS — E1121 Type 2 diabetes mellitus with diabetic nephropathy: Secondary | ICD-10-CM | POA: Diagnosis not present

## 2020-10-18 DIAGNOSIS — R1312 Dysphagia, oropharyngeal phase: Secondary | ICD-10-CM | POA: Diagnosis not present

## 2020-10-18 DIAGNOSIS — M6281 Muscle weakness (generalized): Secondary | ICD-10-CM | POA: Diagnosis not present

## 2020-10-19 DIAGNOSIS — E1121 Type 2 diabetes mellitus with diabetic nephropathy: Secondary | ICD-10-CM | POA: Diagnosis not present

## 2020-10-19 DIAGNOSIS — R1312 Dysphagia, oropharyngeal phase: Secondary | ICD-10-CM | POA: Diagnosis not present

## 2020-10-19 DIAGNOSIS — M6281 Muscle weakness (generalized): Secondary | ICD-10-CM | POA: Diagnosis not present

## 2020-10-20 DIAGNOSIS — R1312 Dysphagia, oropharyngeal phase: Secondary | ICD-10-CM | POA: Diagnosis not present

## 2020-10-20 DIAGNOSIS — M6281 Muscle weakness (generalized): Secondary | ICD-10-CM | POA: Diagnosis not present

## 2020-10-20 DIAGNOSIS — E1121 Type 2 diabetes mellitus with diabetic nephropathy: Secondary | ICD-10-CM | POA: Diagnosis not present

## 2020-10-21 DIAGNOSIS — M6281 Muscle weakness (generalized): Secondary | ICD-10-CM | POA: Diagnosis not present

## 2020-10-21 DIAGNOSIS — R1312 Dysphagia, oropharyngeal phase: Secondary | ICD-10-CM | POA: Diagnosis not present

## 2020-10-21 DIAGNOSIS — E1121 Type 2 diabetes mellitus with diabetic nephropathy: Secondary | ICD-10-CM | POA: Diagnosis not present

## 2020-10-24 DIAGNOSIS — E1121 Type 2 diabetes mellitus with diabetic nephropathy: Secondary | ICD-10-CM | POA: Diagnosis not present

## 2020-10-24 DIAGNOSIS — R1312 Dysphagia, oropharyngeal phase: Secondary | ICD-10-CM | POA: Diagnosis not present

## 2020-10-24 DIAGNOSIS — M6281 Muscle weakness (generalized): Secondary | ICD-10-CM | POA: Diagnosis not present

## 2020-10-25 DIAGNOSIS — M6281 Muscle weakness (generalized): Secondary | ICD-10-CM | POA: Diagnosis not present

## 2020-10-25 DIAGNOSIS — B370 Candidal stomatitis: Secondary | ICD-10-CM | POA: Diagnosis not present

## 2020-10-25 DIAGNOSIS — E1121 Type 2 diabetes mellitus with diabetic nephropathy: Secondary | ICD-10-CM | POA: Diagnosis not present

## 2020-10-26 DIAGNOSIS — M6281 Muscle weakness (generalized): Secondary | ICD-10-CM | POA: Diagnosis not present

## 2020-10-26 DIAGNOSIS — E1121 Type 2 diabetes mellitus with diabetic nephropathy: Secondary | ICD-10-CM | POA: Diagnosis not present

## 2020-10-27 DIAGNOSIS — M6281 Muscle weakness (generalized): Secondary | ICD-10-CM | POA: Diagnosis not present

## 2020-10-27 DIAGNOSIS — E1121 Type 2 diabetes mellitus with diabetic nephropathy: Secondary | ICD-10-CM | POA: Diagnosis not present

## 2020-10-28 DIAGNOSIS — M6281 Muscle weakness (generalized): Secondary | ICD-10-CM | POA: Diagnosis not present

## 2020-10-28 DIAGNOSIS — E1121 Type 2 diabetes mellitus with diabetic nephropathy: Secondary | ICD-10-CM | POA: Diagnosis not present

## 2020-10-31 DIAGNOSIS — M6281 Muscle weakness (generalized): Secondary | ICD-10-CM | POA: Diagnosis not present

## 2020-10-31 DIAGNOSIS — E1121 Type 2 diabetes mellitus with diabetic nephropathy: Secondary | ICD-10-CM | POA: Diagnosis not present

## 2020-11-01 DIAGNOSIS — M6281 Muscle weakness (generalized): Secondary | ICD-10-CM | POA: Diagnosis not present

## 2020-11-01 DIAGNOSIS — E1121 Type 2 diabetes mellitus with diabetic nephropathy: Secondary | ICD-10-CM | POA: Diagnosis not present

## 2020-11-02 DIAGNOSIS — M6281 Muscle weakness (generalized): Secondary | ICD-10-CM | POA: Diagnosis not present

## 2020-11-02 DIAGNOSIS — E1121 Type 2 diabetes mellitus with diabetic nephropathy: Secondary | ICD-10-CM | POA: Diagnosis not present

## 2020-11-03 DIAGNOSIS — E1121 Type 2 diabetes mellitus with diabetic nephropathy: Secondary | ICD-10-CM | POA: Diagnosis not present

## 2020-11-03 DIAGNOSIS — M6281 Muscle weakness (generalized): Secondary | ICD-10-CM | POA: Diagnosis not present

## 2020-11-04 DIAGNOSIS — M6281 Muscle weakness (generalized): Secondary | ICD-10-CM | POA: Diagnosis not present

## 2020-11-04 DIAGNOSIS — E1121 Type 2 diabetes mellitus with diabetic nephropathy: Secondary | ICD-10-CM | POA: Diagnosis not present

## 2020-11-07 DIAGNOSIS — M6281 Muscle weakness (generalized): Secondary | ICD-10-CM | POA: Diagnosis not present

## 2020-11-07 DIAGNOSIS — E1121 Type 2 diabetes mellitus with diabetic nephropathy: Secondary | ICD-10-CM | POA: Diagnosis not present

## 2020-11-08 DIAGNOSIS — M6281 Muscle weakness (generalized): Secondary | ICD-10-CM | POA: Diagnosis not present

## 2020-11-08 DIAGNOSIS — E1121 Type 2 diabetes mellitus with diabetic nephropathy: Secondary | ICD-10-CM | POA: Diagnosis not present

## 2020-11-09 DIAGNOSIS — E1121 Type 2 diabetes mellitus with diabetic nephropathy: Secondary | ICD-10-CM | POA: Diagnosis not present

## 2020-11-09 DIAGNOSIS — M6281 Muscle weakness (generalized): Secondary | ICD-10-CM | POA: Diagnosis not present

## 2020-11-10 DIAGNOSIS — E1121 Type 2 diabetes mellitus with diabetic nephropathy: Secondary | ICD-10-CM | POA: Diagnosis not present

## 2020-11-10 DIAGNOSIS — M6281 Muscle weakness (generalized): Secondary | ICD-10-CM | POA: Diagnosis not present

## 2020-11-11 DIAGNOSIS — M6281 Muscle weakness (generalized): Secondary | ICD-10-CM | POA: Diagnosis not present

## 2020-11-11 DIAGNOSIS — E1121 Type 2 diabetes mellitus with diabetic nephropathy: Secondary | ICD-10-CM | POA: Diagnosis not present

## 2020-11-14 DIAGNOSIS — E1121 Type 2 diabetes mellitus with diabetic nephropathy: Secondary | ICD-10-CM | POA: Diagnosis not present

## 2020-11-14 DIAGNOSIS — M6281 Muscle weakness (generalized): Secondary | ICD-10-CM | POA: Diagnosis not present

## 2020-11-15 DIAGNOSIS — E1121 Type 2 diabetes mellitus with diabetic nephropathy: Secondary | ICD-10-CM | POA: Diagnosis not present

## 2020-11-15 DIAGNOSIS — M6281 Muscle weakness (generalized): Secondary | ICD-10-CM | POA: Diagnosis not present

## 2020-11-23 DIAGNOSIS — M6281 Muscle weakness (generalized): Secondary | ICD-10-CM | POA: Diagnosis not present

## 2020-11-24 DIAGNOSIS — M6281 Muscle weakness (generalized): Secondary | ICD-10-CM | POA: Diagnosis not present

## 2020-11-25 DIAGNOSIS — M6281 Muscle weakness (generalized): Secondary | ICD-10-CM | POA: Diagnosis not present

## 2020-11-29 DIAGNOSIS — M6281 Muscle weakness (generalized): Secondary | ICD-10-CM | POA: Diagnosis not present

## 2020-11-30 DIAGNOSIS — M6281 Muscle weakness (generalized): Secondary | ICD-10-CM | POA: Diagnosis not present

## 2020-12-01 DIAGNOSIS — M6281 Muscle weakness (generalized): Secondary | ICD-10-CM | POA: Diagnosis not present

## 2020-12-02 DIAGNOSIS — M6281 Muscle weakness (generalized): Secondary | ICD-10-CM | POA: Diagnosis not present

## 2020-12-03 DIAGNOSIS — M6281 Muscle weakness (generalized): Secondary | ICD-10-CM | POA: Diagnosis not present

## 2020-12-05 DIAGNOSIS — M6281 Muscle weakness (generalized): Secondary | ICD-10-CM | POA: Diagnosis not present

## 2020-12-06 DIAGNOSIS — M6281 Muscle weakness (generalized): Secondary | ICD-10-CM | POA: Diagnosis not present

## 2020-12-08 DIAGNOSIS — M6281 Muscle weakness (generalized): Secondary | ICD-10-CM | POA: Diagnosis not present

## 2020-12-08 DIAGNOSIS — I872 Venous insufficiency (chronic) (peripheral): Secondary | ICD-10-CM | POA: Diagnosis not present

## 2020-12-08 DIAGNOSIS — E119 Type 2 diabetes mellitus without complications: Secondary | ICD-10-CM | POA: Diagnosis not present

## 2020-12-08 DIAGNOSIS — K7460 Unspecified cirrhosis of liver: Secondary | ICD-10-CM | POA: Diagnosis not present

## 2020-12-09 DIAGNOSIS — M6281 Muscle weakness (generalized): Secondary | ICD-10-CM | POA: Diagnosis not present

## 2020-12-10 DIAGNOSIS — M6281 Muscle weakness (generalized): Secondary | ICD-10-CM | POA: Diagnosis not present

## 2020-12-12 DIAGNOSIS — M6281 Muscle weakness (generalized): Secondary | ICD-10-CM | POA: Diagnosis not present

## 2020-12-13 DIAGNOSIS — M6281 Muscle weakness (generalized): Secondary | ICD-10-CM | POA: Diagnosis not present

## 2020-12-14 DIAGNOSIS — M6281 Muscle weakness (generalized): Secondary | ICD-10-CM | POA: Diagnosis not present

## 2020-12-16 DIAGNOSIS — M6281 Muscle weakness (generalized): Secondary | ICD-10-CM | POA: Diagnosis not present

## 2020-12-17 DIAGNOSIS — M6281 Muscle weakness (generalized): Secondary | ICD-10-CM | POA: Diagnosis not present

## 2020-12-18 DIAGNOSIS — M6281 Muscle weakness (generalized): Secondary | ICD-10-CM | POA: Diagnosis not present

## 2020-12-20 DIAGNOSIS — R52 Pain, unspecified: Secondary | ICD-10-CM | POA: Diagnosis not present

## 2020-12-20 DIAGNOSIS — R5383 Other fatigue: Secondary | ICD-10-CM | POA: Diagnosis not present

## 2020-12-23 DEATH — deceased
